# Patient Record
Sex: Male | Born: 1985
Health system: Southern US, Community
[De-identification: ages and names within clinical notes are randomized; demographics above are authoritative.]

## PROBLEM LIST (undated history)

## (undated) DIAGNOSIS — Z800 Family history of malignant neoplasm of digestive organs: Secondary | ICD-10-CM

## (undated) DIAGNOSIS — F329 Major depressive disorder, single episode, unspecified: Secondary | ICD-10-CM

## (undated) DIAGNOSIS — K635 Polyp of colon: Secondary | ICD-10-CM

## (undated) DIAGNOSIS — B20 Human immunodeficiency virus [HIV] disease: Secondary | ICD-10-CM

## (undated) DIAGNOSIS — Z9151 Personal history of suicidal behavior: Secondary | ICD-10-CM

## (undated) DIAGNOSIS — F32A Depression, unspecified: Secondary | ICD-10-CM

## (undated) DIAGNOSIS — Z21 Asymptomatic human immunodeficiency virus [HIV] infection status: Secondary | ICD-10-CM

## (undated) DIAGNOSIS — Z915 Personal history of self-harm: Secondary | ICD-10-CM

## (undated) HISTORY — DX: Personal history of suicidal behavior: Z91.51

## (undated) HISTORY — DX: Major depressive disorder, single episode, unspecified: F32.9

## (undated) HISTORY — DX: Depression, unspecified: F32.A

## (undated) HISTORY — DX: Polyp of colon: K63.5

## (undated) HISTORY — DX: Family history of malignant neoplasm of digestive organs: Z80.0

## (undated) HISTORY — DX: Human immunodeficiency virus (HIV) disease: B20

## (undated) HISTORY — DX: Personal history of self-harm: Z91.5

## (undated) HISTORY — DX: Asymptomatic human immunodeficiency virus (hiv) infection status: Z21

---

## 2010-11-26 ENCOUNTER — Emergency Department (HOSPITAL_COMMUNITY): Payer: Self-pay

## 2010-11-26 ENCOUNTER — Emergency Department (HOSPITAL_COMMUNITY)
Admission: EM | Admit: 2010-11-26 | Discharge: 2010-11-26 | Disposition: A | Discharge status: Home / Self Care | Payer: Self-pay | Attending: Emergency Medicine | Admitting: Emergency Medicine

## 2010-11-26 DIAGNOSIS — IMO0002 Reserved for concepts with insufficient information to code with codable children: Secondary | ICD-10-CM | POA: Insufficient documentation

## 2010-11-26 DIAGNOSIS — L299 Pruritus, unspecified: Secondary | ICD-10-CM | POA: Insufficient documentation

## 2010-11-26 DIAGNOSIS — T368X5A Adverse effect of other systemic antibiotics, initial encounter: Secondary | ICD-10-CM | POA: Insufficient documentation

## 2010-11-26 DIAGNOSIS — M25429 Effusion, unspecified elbow: Secondary | ICD-10-CM | POA: Insufficient documentation

## 2010-11-26 DIAGNOSIS — Y9210 Unspecified residential institution as the place of occurrence of the external cause: Secondary | ICD-10-CM | POA: Insufficient documentation

## 2010-11-26 DIAGNOSIS — M25529 Pain in unspecified elbow: Secondary | ICD-10-CM | POA: Insufficient documentation

## 2010-11-26 LAB — BASIC METABOLIC PANEL
BUN: 8 mg/dL (ref 6–23)
CO2: 23 mEq/L (ref 19–32)
Calcium: 8.8 mg/dL (ref 8.4–10.5)
Chloride: 103 mEq/L (ref 96–112)
Creatinine, Ser: 0.57 mg/dL (ref 0.4–1.5)
GFR calc Af Amer: >60 mL/min (ref >60)
GFR calc non Af Amer: >60 mL/min (ref >60)
Glucose, Bld: 90 mg/dL (ref 70–99)
Potassium: 3.8 mEq/L (ref 3.5–5.1)
Sodium: 136 mEq/L (ref 135–145)

## 2010-11-26 LAB — CBC
HCT: 37.8 % — ABNORMAL LOW (ref 39.0–52.0)
Hemoglobin: 13.5 g/dL (ref 13.0–17.0)
MCH: 30.6 pg (ref 26.0–34.0)
MCHC: 35.7 g/dL (ref 30.0–36.0)
MCV: 85.7 fL (ref 78.0–100.0)
Platelets: 172 10*3/uL (ref 150–400)
RBC: 4.41 MIL/uL (ref 4.22–5.81)
RDW: 12.3 % (ref 11.5–15.5)
WBC: 8.4 10*3/uL (ref 4.0–10.5)

## 2010-11-26 LAB — DIFFERENTIAL
Basophils Absolute: 0 10*3/uL (ref 0.0–0.1)
Basophils Relative: 0 % (ref 0–1)
Eosinophils Absolute: 0 10*3/uL (ref 0.0–0.7)
Eosinophils Relative: 1 % (ref 0–5)
Lymphocytes Relative: 14 % (ref 12–46)
Lymphs Abs: 1.2 10*3/uL (ref 0.7–4.0)
Monocytes Absolute: 0.9 10*3/uL (ref 0.1–1.0)
Monocytes Relative: 11 % (ref 3–12)
Neutro Abs: 6.3 10*3/uL (ref 1.7–7.7)
Neutrophils Relative %: 75 % (ref 43–77)

## 2010-11-28 ENCOUNTER — Emergency Department (HOSPITAL_COMMUNITY)
Admission: EM | Admit: 2010-11-28 | Discharge: 2010-11-28 | Disposition: A | Discharge status: Home / Self Care | Payer: Self-pay | Attending: Emergency Medicine | Admitting: Emergency Medicine

## 2010-11-28 DIAGNOSIS — S6990XA Unspecified injury of unspecified wrist, hand and finger(s), initial encounter: Secondary | ICD-10-CM | POA: Insufficient documentation

## 2010-11-28 DIAGNOSIS — M25429 Effusion, unspecified elbow: Secondary | ICD-10-CM | POA: Insufficient documentation

## 2010-11-28 DIAGNOSIS — Y92009 Unspecified place in unspecified non-institutional (private) residence as the place of occurrence of the external cause: Secondary | ICD-10-CM | POA: Insufficient documentation

## 2010-11-28 DIAGNOSIS — R509 Fever, unspecified: Secondary | ICD-10-CM | POA: Insufficient documentation

## 2010-11-28 DIAGNOSIS — M25529 Pain in unspecified elbow: Secondary | ICD-10-CM | POA: Insufficient documentation

## 2010-11-28 DIAGNOSIS — IMO0002 Reserved for concepts with insufficient information to code with codable children: Secondary | ICD-10-CM | POA: Insufficient documentation

## 2010-11-28 DIAGNOSIS — X58XXXA Exposure to other specified factors, initial encounter: Secondary | ICD-10-CM | POA: Insufficient documentation

## 2010-11-28 DIAGNOSIS — S59909A Unspecified injury of unspecified elbow, initial encounter: Secondary | ICD-10-CM | POA: Insufficient documentation

## 2010-11-28 LAB — DIFFERENTIAL
Basophils Absolute: 0 10*3/uL (ref 0.0–0.1)
Basophils Relative: 0 % (ref 0–1)
Eosinophils Absolute: 0.1 10*3/uL (ref 0.0–0.7)
Eosinophils Relative: 1 % (ref 0–5)
Lymphocytes Relative: 14 % (ref 12–46)
Lymphs Abs: 1 10*3/uL (ref 0.7–4.0)
Monocytes Absolute: 0.8 10*3/uL (ref 0.1–1.0)
Monocytes Relative: 11 % (ref 3–12)
Neutro Abs: 5.2 10*3/uL (ref 1.7–7.7)
Neutrophils Relative %: 74 % (ref 43–77)

## 2010-11-28 LAB — POCT I-STAT, CHEM 8
BUN: 7 mg/dL (ref 6–23)
Calcium, Ion: 1.1 mmol/L — ABNORMAL LOW (ref 1.12–1.32)
Chloride: 103 mEq/L (ref 96–112)
Creatinine, Ser: 1 mg/dL (ref 0.4–1.5)
Glucose, Bld: 104 mg/dL — ABNORMAL HIGH (ref 70–99)
HCT: 38 % — ABNORMAL LOW (ref 39.0–52.0)
Hemoglobin: 12.9 g/dL — ABNORMAL LOW (ref 13.0–17.0)
Potassium: 3.6 mEq/L (ref 3.5–5.1)
Sodium: 135 mEq/L (ref 135–145)
TCO2: 20 mmol/L (ref 0–100)

## 2010-11-28 LAB — CBC
HCT: 35.4 % — ABNORMAL LOW (ref 39.0–52.0)
Hemoglobin: 12.7 g/dL — ABNORMAL LOW (ref 13.0–17.0)
MCH: 30.5 pg (ref 26.0–34.0)
MCHC: 35.9 g/dL (ref 30.0–36.0)
MCV: 85.1 fL (ref 78.0–100.0)
Platelets: 184 10*3/uL (ref 150–400)
RBC: 4.16 MIL/uL — ABNORMAL LOW (ref 4.22–5.81)
RDW: 12.1 % (ref 11.5–15.5)
WBC: 7.1 10*3/uL (ref 4.0–10.5)

## 2010-12-11 NOTE — Op Note (Signed)
  NAMEDEREKE, NEUMANN              ACCOUNT NO.:  [id number]  MEDICAL RECORD NO.:  [id number]  LOCATION:  [facility]                         FACILITY:  [facility]  PHYSICIAN:  Harvie Junior, M.D.   DATE OF BIRTH:  Nov 27, 1985  DATE OF PROCEDURE:  11/26/2010 DATE OF DISCHARGE:  11/28/2010                              OPERATIVE REPORT   PREOPERATIVE DIAGNOSIS:  Infected olecranon bursitis, right.  POSTOPERATIVE DIAGNOSIS:  Infected olecranon bursitis, right.  PROCEDURES:  Incision and drainage of infected right olecranon bursitis.  SURGEON:  Harvie Junior, MD  ASSISTANT:  Marshia Ly, P.A.  ANESTHESIA:  Local.  BRIEF HISTORY:  Mr. Sciarra is a 25 year old male with a history of having had cellulitis and fluctuance of his right elbow.  It began getting worse and worse, thicker and thicker, was having fevers, nausea, vomiting, loss of appetite.  Because of these complaints, the patient presented to the emergency room.  Examination showed that he had a significant cellulitic elbow with some fluctuance, right over the elbow itself.  We felt that I and D is the most appropriate course of action.  PROCEDURE:  The patient in a stretcher because of failure of conservative care and infected olecranon bursitis, had a routine prep and drape of the elbow and then a small incision was made over the elbow and an irrigation and debridement through this wound was covered, and a Q-tip was used to clean this out as best as we could.  At this point, a small wick was put in place.  Sterile compressive dressing was applied. The patient then had a posterior splint applied and he was discharged to follow up in our office.     Harvie Junior, M.D.     Ranae Plumber  D:  11/30/2010  T:  12/01/2010  Job:  [id number]  Electronically Signed by Jodi Geralds M.D. on 12/11/2010 12:20:43 AM

## 2010-12-11 NOTE — Consult Note (Signed)
  NAME:  GURVEER, COLUCCI NO.:  [id number]  MEDICAL RECORD NO.:  [id number]  LOCATION:  [facility]                         FACILITY:  [facility]  PHYSICIAN:  Harvie Junior, M.D.   DATE OF BIRTH:  May 08, 1986  DATE OF CONSULTATION:  11/26/2010 DATE OF DISCHARGE:  11/26/2010                                CONSULTATION   Mr. Beale is a 25 year old male with a several-day history of increasing redness over the right elbow, increasing pain, and because of continued complaints of pain, he presented to the emergency room with redness, pain, and have been running some fevers at home.  He basically says that he had been having intermittent moderate stabbing pain associated with swelling, getting better since started.  It  does not awake him from sleep, that work made worse and rest made better.  He came in for evaluation.  PAST MEDICAL HISTORY:  Remarkable for not being allergic to medications. He denies diabetes, high cholesterol.  REVIEW OF SYSTEMS:  A 14-point systems were reviewed thoroughly, positive only for some loss of appetite, fever, __________, rash, and headaches.  His medical problems were positive for depression.  He has been hospitalized for depression in the past.  FAMILY HISTORY:  Reviewed and noncontributory.  SOCIAL HISTORY:  He is a pack-a-day smoker and drinks couple of times a week.  He works at [employer] as a Production assistant, radio.  PHYSICAL EXAMINATION:  He was a well-developed, well-nourished male in no acute distress.  Eyes are not dilated.  Not using accessory muscle for respiration.  No rash on exposed skin.  Right elbow showed that he had significant erythema over a very large area of the elbow, probably a 15 x 3 cm area of erythema.  There was some local fluctuance right at the elbow itself.  Overall it is our assessment that this potentially could be treated as an outpatient where we are very much concerned about that possibility, so we felt  that most proper course of action for him was going to be to do a bedside I and D and to treat him with IV antibiotics in the emergency room and then to follow up in our office for treatment with IM and p.o. antibiotics and an additional drainage as needed.  The drainage procedure will be dictated under separate operative note.    Harvie Junior, M.D.    Ranae Plumber  D:  11/30/2010  T:  12/01/2010  Job:  [id number]  Electronically Signed by Jodi Geralds M.D. on 12/11/2010 12:20:41 AM

## 2011-07-10 ENCOUNTER — Emergency Department (HOSPITAL_COMMUNITY)
Admission: EM | Admit: 2011-07-10 | Discharge: 2011-07-10 | Disposition: A | Discharge status: Home / Self Care | Payer: Self-pay | Attending: Emergency Medicine | Admitting: Emergency Medicine

## 2011-07-10 ENCOUNTER — Encounter (HOSPITAL_COMMUNITY): Payer: Self-pay

## 2011-07-10 DIAGNOSIS — R51 Headache: Secondary | ICD-10-CM | POA: Insufficient documentation

## 2011-07-10 DIAGNOSIS — IMO0001 Reserved for inherently not codable concepts without codable children: Secondary | ICD-10-CM | POA: Insufficient documentation

## 2011-07-10 DIAGNOSIS — R630 Anorexia: Secondary | ICD-10-CM | POA: Insufficient documentation

## 2011-07-10 DIAGNOSIS — J3489 Other specified disorders of nose and nasal sinuses: Secondary | ICD-10-CM | POA: Insufficient documentation

## 2011-07-10 DIAGNOSIS — R05 Cough: Secondary | ICD-10-CM | POA: Insufficient documentation

## 2011-07-10 DIAGNOSIS — B349 Viral infection, unspecified: Secondary | ICD-10-CM

## 2011-07-10 DIAGNOSIS — R42 Dizziness and giddiness: Secondary | ICD-10-CM | POA: Insufficient documentation

## 2011-07-10 DIAGNOSIS — B9789 Other viral agents as the cause of diseases classified elsewhere: Secondary | ICD-10-CM | POA: Insufficient documentation

## 2011-07-10 DIAGNOSIS — R5383 Other fatigue: Secondary | ICD-10-CM | POA: Insufficient documentation

## 2011-07-10 DIAGNOSIS — M2550 Pain in unspecified joint: Secondary | ICD-10-CM | POA: Insufficient documentation

## 2011-07-10 DIAGNOSIS — R112 Nausea with vomiting, unspecified: Secondary | ICD-10-CM | POA: Insufficient documentation

## 2011-07-10 DIAGNOSIS — R059 Cough, unspecified: Secondary | ICD-10-CM | POA: Insufficient documentation

## 2011-07-10 DIAGNOSIS — R5381 Other malaise: Secondary | ICD-10-CM | POA: Insufficient documentation

## 2011-07-10 DIAGNOSIS — R070 Pain in throat: Secondary | ICD-10-CM | POA: Insufficient documentation

## 2011-07-10 DIAGNOSIS — R509 Fever, unspecified: Secondary | ICD-10-CM | POA: Insufficient documentation

## 2011-07-10 LAB — RAPID STREP SCREEN (MED CTR MEBANE ONLY): Streptococcus, Group A Screen (Direct): NEGATIVE

## 2011-07-10 MED ORDER — ONDANSETRON HCL 4 MG PO TABS: 4.0000 mg | ORAL_TABLET | Freq: Four times a day (QID) | ORAL | Status: AC

## 2011-07-10 MED ORDER — ONDANSETRON 4 MG PO TBDP
4.0000 mg | ORAL_TABLET | Freq: Once | ORAL | Status: AC
  Administered 2011-07-10: 4 mg via ORAL
  Filled 2011-07-10: qty 1

## 2011-07-10 MED ORDER — IBUPROFEN 800 MG PO TABS
800.0000 mg | ORAL_TABLET | Freq: Once | ORAL | Status: AC
  Administered 2011-07-10: 800 mg via ORAL
  Filled 2011-07-10: qty 1

## 2011-07-10 NOTE — ED Notes (Signed)
MD at bedside. 

## 2011-07-10 NOTE — ED Notes (Signed)
Pt c/o general flu symptoms x 4 days.  Has been taking motrin at home for fever

## 2011-07-10 NOTE — ED Notes (Signed)
Pt drank 3 apple juices without nausea.  States he feels some better

## 2011-07-10 NOTE — ED Provider Notes (Signed)
History     CSN: [id number]  Arrival date & time 07/10/11  0803   First MD Initiated Contact with Patient 07/10/11 [phone]      Chief Complaint  Patient presents with  . Influenza    (Consider location/radiation/quality/duration/timing/severity/associated sxs/prior treatment) HPI Comments: 4 days of body aches, nausea, vomiting, fever, congestion, cough.  Poor PO intake.  Last vomiting last night.  No chest pain, SOB.  Mild sore throat.  Cough is nonproductive.  No rash.  NSAIDs at home for fever relief.    Patient is a 26 y.o. male presenting with flu symptoms. The history is provided by the patient.  Influenza This is a new problem. The current episode started more than 2 days ago. The problem occurs constantly. The problem has not changed since onset.Associated symptoms include headaches. Pertinent negatives include no chest pain, no abdominal pain and no shortness of breath. The symptoms are aggravated by nothing. The symptoms are relieved by nothing. He has tried acetaminophen for the symptoms.    History reviewed. No pertinent past medical history.  History reviewed. No pertinent past surgical history.  No family history on file.  History  Substance Use Topics  . Smoking status: Not on file  . Smokeless tobacco: Not on file  . Alcohol Use: Yes     occasional      Review of Systems  Constitutional: Positive for fever, chills, activity change, appetite change and fatigue.  HENT: Positive for congestion, sore throat and rhinorrhea. Negative for trouble swallowing.   Eyes: Negative for visual disturbance.  Respiratory: Positive for cough. Negative for shortness of breath.   Cardiovascular: Negative for chest pain.  Gastrointestinal: Positive for nausea and vomiting. Negative for abdominal pain and diarrhea.  Genitourinary: Negative for dysuria and hematuria.  Musculoskeletal: Positive for myalgias and arthralgias. Negative for back pain.  Neurological: Positive for  weakness, light-headedness and headaches. Negative for dizziness.    Allergies  Review of patient's allergies indicates no known allergies.  Home Medications   Current Outpatient Rx  Name Route Sig Dispense Refill  . IBUPROFEN 200 MG PO TABS Oral Take 200-400 mg by mouth every 6 (six) hours as needed.    Marland Kitchen ONDANSETRON HCL 4 MG PO TABS Oral Take 1 tablet (4 mg total) by mouth every 6 (six) hours. 12 tablet 0    BP 110/70  Pulse 92  Temp(Src) 100.2 F (37.9 C) (Oral)  SpO2 98%  Physical Exam  Constitutional: He is oriented to person, place, and time. He appears well-developed and well-nourished. No distress.  HENT:  Head: Normocephalic and atraumatic.  Mouth/Throat: Oropharynx is clear and moist. No oropharyngeal exudate.       Mild erythema to oropharynx. No asymmetry  Eyes: Conjunctivae and EOM are normal.  Neck: Normal range of motion. Neck supple.       No meningismus  Cardiovascular: Normal rate, regular rhythm and normal heart sounds.   No murmur heard. Pulmonary/Chest: Effort normal and breath sounds normal. No respiratory distress. He has no wheezes.  Abdominal: Soft. There is no tenderness. There is no rebound and no guarding.  Musculoskeletal: Normal range of motion. He exhibits no edema and no tenderness.  Neurological: He is alert and oriented to person, place, and time. No cranial nerve deficit.  Skin: Skin is warm.    ED Course  Procedures (including critical care time)   Labs Reviewed  RAPID STREP SCREEN   No results found.   1. Viral syndrome  MDM  Viral syndrome with body aches, fever, nausea, vomiting.  No meningismus, normal neuro exam. Abdomen soft.  Antipyretics, fluids, zofran Patient tolerating by mouth in the ED, abdomen soft.       Glynn Octave, MD 07/10/11 [phone]

## 2011-07-10 NOTE — ED Notes (Signed)
Pt drinking po apple juice without problem

## 2011-11-04 LAB — TB SKIN TEST
Induration: 0 mm
TB Skin Test: NEGATIVE

## 2011-11-15 ENCOUNTER — Ambulatory Visit (INDEPENDENT_AMBULATORY_CARE_PROVIDER_SITE_OTHER): Payer: Self-pay

## 2011-11-15 ENCOUNTER — Other Ambulatory Visit: Payer: Self-pay | Admitting: Internal Medicine

## 2011-11-15 DIAGNOSIS — Z79899 Other long term (current) drug therapy: Secondary | ICD-10-CM

## 2011-11-15 DIAGNOSIS — B20 Human immunodeficiency virus [HIV] disease: Secondary | ICD-10-CM

## 2011-11-15 DIAGNOSIS — Z113 Encounter for screening for infections with a predominantly sexual mode of transmission: Secondary | ICD-10-CM

## 2011-11-15 LAB — CBC WITH DIFFERENTIAL/PLATELET
Basophils Absolute: 0 10*3/uL (ref 0.0–0.1)
Basophils Relative: 1 % (ref 0–1)
Eosinophils Absolute: 0.1 10*3/uL (ref 0.0–0.7)
Eosinophils Relative: 2 % (ref 0–5)
HCT: 42.3 % (ref 39.0–52.0)
Hemoglobin: 15 g/dL (ref 13.0–17.0)
Lymphocytes Relative: 30 % (ref 12–46)
Lymphs Abs: 1.2 10*3/uL (ref 0.7–4.0)
MCH: 30.1 pg (ref 26.0–34.0)
MCHC: 35.5 g/dL (ref 30.0–36.0)
MCV: 84.9 fL (ref 78.0–100.0)
Monocytes Absolute: 0.4 10*3/uL (ref 0.1–1.0)
Monocytes Relative: 10 % (ref 3–12)
Neutro Abs: 2.2 10*3/uL (ref 1.7–7.7)
Neutrophils Relative %: 57 % (ref 43–77)
Platelets: 190 10*3/uL (ref 150–400)
RBC: 4.98 MIL/uL (ref 4.22–5.81)
RDW: 12.9 % (ref 11.5–15.5)
WBC: 3.9 10*3/uL — ABNORMAL LOW (ref 4.0–10.5)

## 2011-11-15 LAB — LIPID PANEL
Cholesterol: 155 mg/dL (ref 0–200)
HDL: 65 mg/dL (ref >39)
LDL Cholesterol: 82 mg/dL (ref 0–99)
Total CHOL/HDL Ratio: 2.4 Ratio
Triglycerides: 39 mg/dL (ref <150)
VLDL: 8 mg/dL (ref 0–40)

## 2011-11-15 LAB — COMPLETE METABOLIC PANEL WITH GFR
ALT: 12 U/L (ref 0–53)
AST: 17 U/L (ref 0–37)
Albumin: 4.4 g/dL (ref 3.5–5.2)
Alkaline Phosphatase: 60 U/L (ref 39–117)
BUN: 11 mg/dL (ref 6–23)
CO2: 31 mEq/L (ref 19–32)
Calcium: 9.1 mg/dL (ref 8.4–10.5)
Chloride: 102 mEq/L (ref 96–112)
Creat: 0.86 mg/dL (ref 0.50–1.35)
GFR, Est African American: >89 mL/min
GFR, Est Non African American: >89 mL/min
Glucose, Bld: 104 mg/dL — ABNORMAL HIGH (ref 70–99)
Potassium: 4.1 mEq/L (ref 3.5–5.3)
Sodium: 138 mEq/L (ref 135–145)
Total Bilirubin: 0.5 mg/dL (ref 0.3–1.2)
Total Protein: 7 g/dL (ref 6.0–8.3)

## 2011-11-15 LAB — HEPATITIS B SURFACE ANTIBODY,QUALITATIVE: Hep B S Ab: NEGATIVE

## 2011-11-15 LAB — RPR

## 2011-11-15 LAB — HEPATITIS C ANTIBODY: HCV Ab: NEGATIVE

## 2011-11-15 LAB — HEPATITIS B SURFACE ANTIGEN: Hepatitis B Surface Ag: NEGATIVE

## 2011-11-16 DIAGNOSIS — F329 Major depressive disorder, single episode, unspecified: Secondary | ICD-10-CM | POA: Insufficient documentation

## 2011-11-16 LAB — URINALYSIS
Bilirubin Urine: NEGATIVE
Glucose, UA: NEGATIVE mg/dL
Hgb urine dipstick: NEGATIVE
Ketones, ur: NEGATIVE mg/dL
Leukocytes, UA: NEGATIVE
Nitrite: NEGATIVE
Protein, ur: NEGATIVE mg/dL
Specific Gravity, Urine: 1.02 (ref 1.005–1.030)
Urobilinogen, UA: 0.2 mg/dL (ref 0.0–1.0)
pH: 6 (ref 5.0–8.0)

## 2011-11-16 LAB — HEPATITIS B CORE ANTIBODY, TOTAL: Hep B Core Total Ab: NEGATIVE

## 2011-11-16 LAB — T-HELPER CELL (CD4) - (RCID CLINIC ONLY)
CD4 % Helper T Cell: 37 % (ref 33–55)
CD4 T Cell Abs: 450 uL (ref 400–2700)

## 2011-11-16 LAB — HIV-1 RNA ULTRAQUANT REFLEX TO GENTYP+
HIV 1 RNA Quant: 26059 copies/mL — ABNORMAL HIGH (ref <20)
HIV-1 RNA Quant, Log: 4.42 {Log} — ABNORMAL HIGH (ref <1.30)

## 2011-11-16 LAB — HEPATITIS A ANTIBODY, TOTAL: Hep A Total Ab: NEGATIVE

## 2011-11-16 NOTE — Progress Notes (Signed)
Pt states increased depression with recent diagnosis of HIV.  He would like to seek mental health counseling.  Referral given for [facility] [place].  Pt advised to call ASAP due to previous history of mental health diagnosis.  He attends school part- time .

## 2011-11-18 ENCOUNTER — Ambulatory Visit: Payer: Self-pay

## 2011-11-22 LAB — HIV-1 GENOTYPR PLUS

## 2011-12-01 ENCOUNTER — Encounter: Payer: Self-pay | Admitting: Internal Medicine

## 2011-12-01 ENCOUNTER — Other Ambulatory Visit: Payer: Self-pay | Admitting: Internal Medicine

## 2011-12-01 ENCOUNTER — Ambulatory Visit (INDEPENDENT_AMBULATORY_CARE_PROVIDER_SITE_OTHER): Payer: Self-pay | Admitting: Internal Medicine

## 2011-12-01 VITALS — BP 134/78 | HR 86 | Temp 98.0°F | Ht 75.0 in | Wt 154.0 lb

## 2011-12-01 DIAGNOSIS — F32A Depression, unspecified: Secondary | ICD-10-CM

## 2011-12-01 DIAGNOSIS — Z23 Encounter for immunization: Secondary | ICD-10-CM

## 2011-12-01 DIAGNOSIS — F329 Major depressive disorder, single episode, unspecified: Secondary | ICD-10-CM

## 2011-12-01 DIAGNOSIS — F3289 Other specified depressive episodes: Secondary | ICD-10-CM

## 2011-12-01 DIAGNOSIS — Z21 Asymptomatic human immunodeficiency virus [HIV] infection status: Secondary | ICD-10-CM

## 2011-12-01 DIAGNOSIS — B20 Human immunodeficiency virus [HIV] disease: Secondary | ICD-10-CM

## 2011-12-01 MED ORDER — ELVITEG-COBIC-EMTRICIT-TENOFDF 150-150-200-300 MG PO TABS: 1.0000 | ORAL_TABLET | Freq: Every day | ORAL | Status: DC

## 2011-12-01 NOTE — Patient Instructions (Signed)
Have your lab tests done 3-4 weeks after starting your medicine.

## 2011-12-01 NOTE — Assessment & Plan Note (Signed)
I discussed treatment options and he is eager to start.  He does feel he will be compliant.  I discussed side effects of the different options and he has decided to start Stribild.  He does have a history of depression and behavioral health hospital admission so will avoid Atripla.  He will start as soon as he qualifies for ADAP.  He will return 3-4 weeks for labs after starting the meds and with me 1-2 weeks later.  He was told to call if he is having any difficulty with the meds and not to stop without discussing with me or other provider first.  I spent > 45 minutes with more than half with face to face counselling.  He was reminded to use condoms with all sexual activity.  He also will be referred to a primary doctor with his concerns of colon cancer and other, non-ID related issues.

## 2011-12-01 NOTE — Assessment & Plan Note (Addendum)
I did discuss with him depression and need for mental health counselling.  He was seen by our provider today and will continue to followup.  No SI.

## 2011-12-01 NOTE — Progress Notes (Signed)
  Subjective:    Patient ID: James Jensen, male    DOB: 06/30/1985, 26 y.o.   MRN: [id number]  HPI Here to as a new patient with 042.  This is a new diagnosis for him and he is adjusting to the news.  He has a history of GC and/or chlamydia infection (he is not sure) years ago and no known herpes or orther STIs.  Is interested in treatment.  Also has many other concerns related to mother's colon cancer and his risks, rash on back and concern for cancer.  Does smoke daily.  Complains of depression, particularly associated with the diagnosis.  He does have questions regarding medication side effects and different options.  He does feel he is ready to take medicine every day.     Review of Systems  Constitutional: Negative for fever, chills, fatigue and unexpected weight change.  HENT: Negative for sore throat and trouble swallowing.   Respiratory: Negative for cough, chest tightness and shortness of breath.   Cardiovascular: Negative for chest pain, palpitations and leg swelling.  Gastrointestinal: Negative for nausea, abdominal pain and diarrhea.  Genitourinary: Negative for discharge and genital sores.  Musculoskeletal: Negative for myalgias, joint swelling and arthralgias.  Neurological: Negative for dizziness, weakness and headaches.  Hematological: Negative for adenopathy.  Psychiatric/Behavioral: Positive for dysphoric mood. The patient is not nervous/anxious.        Objective:   Physical Exam  Constitutional: He is oriented to person, place, and time. He appears well-developed and well-nourished. No distress.  HENT:  Mouth/Throat: Oropharynx is clear and moist. No oropharyngeal exudate.       Small (less than 1/4 cm) right ant cervical lad  Cardiovascular: Normal rate, regular rhythm and normal heart sounds.  Exam reveals no gallop and no friction rub.   No murmur heard. Pulmonary/Chest: Effort normal and breath sounds normal. No respiratory distress. He has no wheezes. He has no  rales.  Abdominal: Soft. Bowel sounds are normal. He exhibits no distension. There is no tenderness.  Genitourinary: Penis normal. No penile tenderness.  Lymphadenopathy:    He has cervical adenopathy.  Neurological: He is alert and oriented to person, place, and time.  Skin: Skin is warm and dry.  Psychiatric: He has a normal mood and affect. His behavior is normal.          Assessment & Plan:

## 2011-12-06 ENCOUNTER — Ambulatory Visit: Payer: Self-pay

## 2011-12-06 DIAGNOSIS — F331 Major depressive disorder, recurrent, moderate: Secondary | ICD-10-CM

## 2011-12-06 NOTE — Progress Notes (Unsigned)
Subjective:    Patient ID: James Jensen is a 26 y.o. male.  Chief Complaint: {ZOX:09604} {Additional complaints:19341::" "} Data Review: {Data Review:18591} {Common ambulatory SmartLinks:19316::" "} ROS  Objective:  Physical Exam  Laboratory:  {Lab Links:19343::" "}  Assessment:   {Assessment:19344}  Plan:   {VWUJ:81191}

## 2011-12-06 NOTE — Patient Instructions (Addendum)
Plan to meet in one week.
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
## 2011-12-13 ENCOUNTER — Ambulatory Visit: Payer: Self-pay

## 2011-12-13 IMAGING — CR DG ELBOW COMPLETE 3+V*R*
4 series · 4 of 4 positions shown · non-contrast
Comparison: None.

CLINICAL DATA: Elbow swelling and limited range of motion.

RIGHT ELBOW - COMPLETE 3+ VIEW

[x elbow joint ap right]
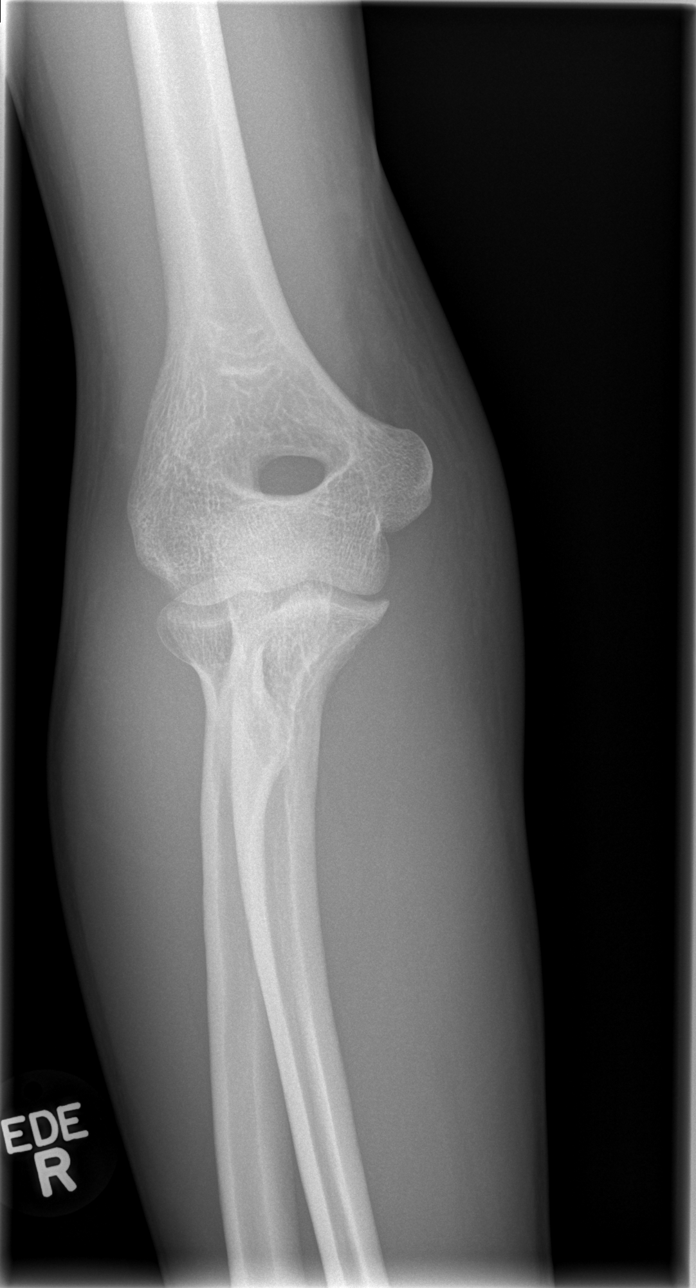

[x elbow joint obl. right (1 of 2)]
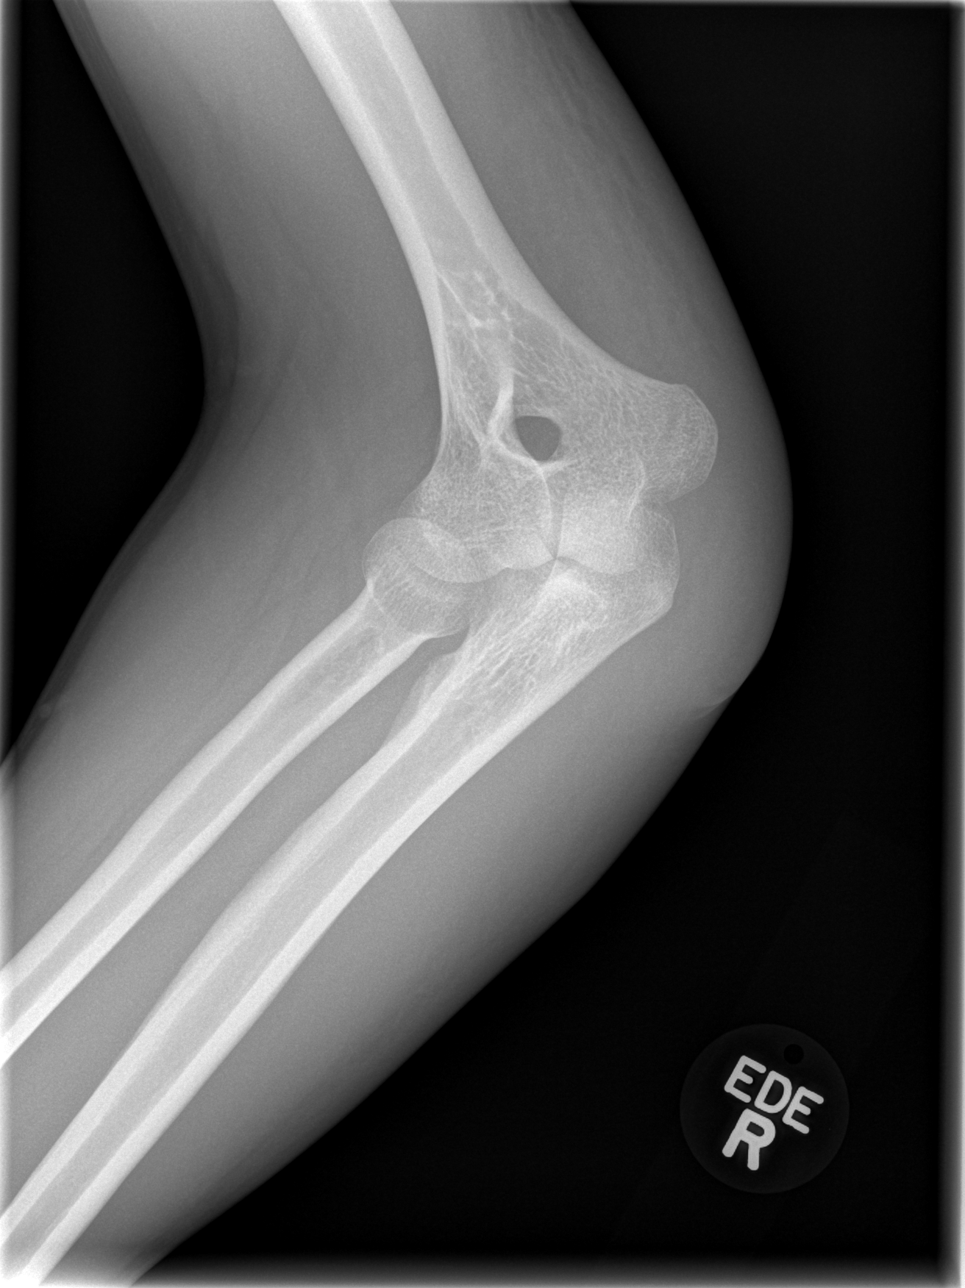

[x elbow joint obl. right (2 of 2)]
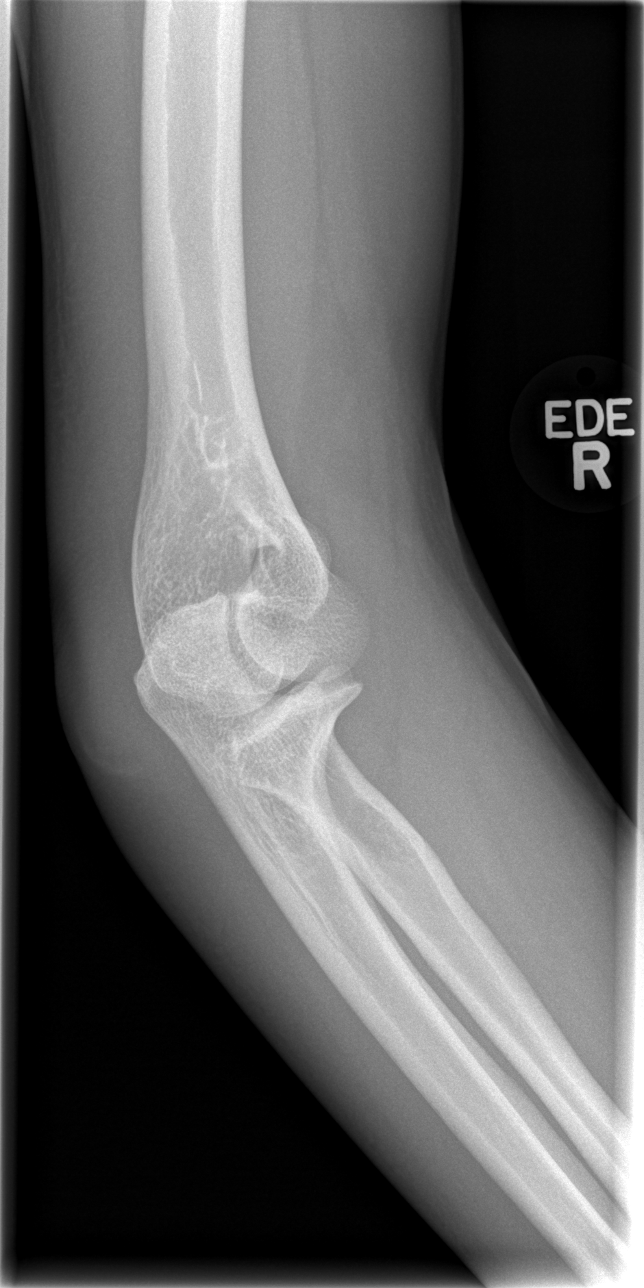

[x elbow joint lat right]
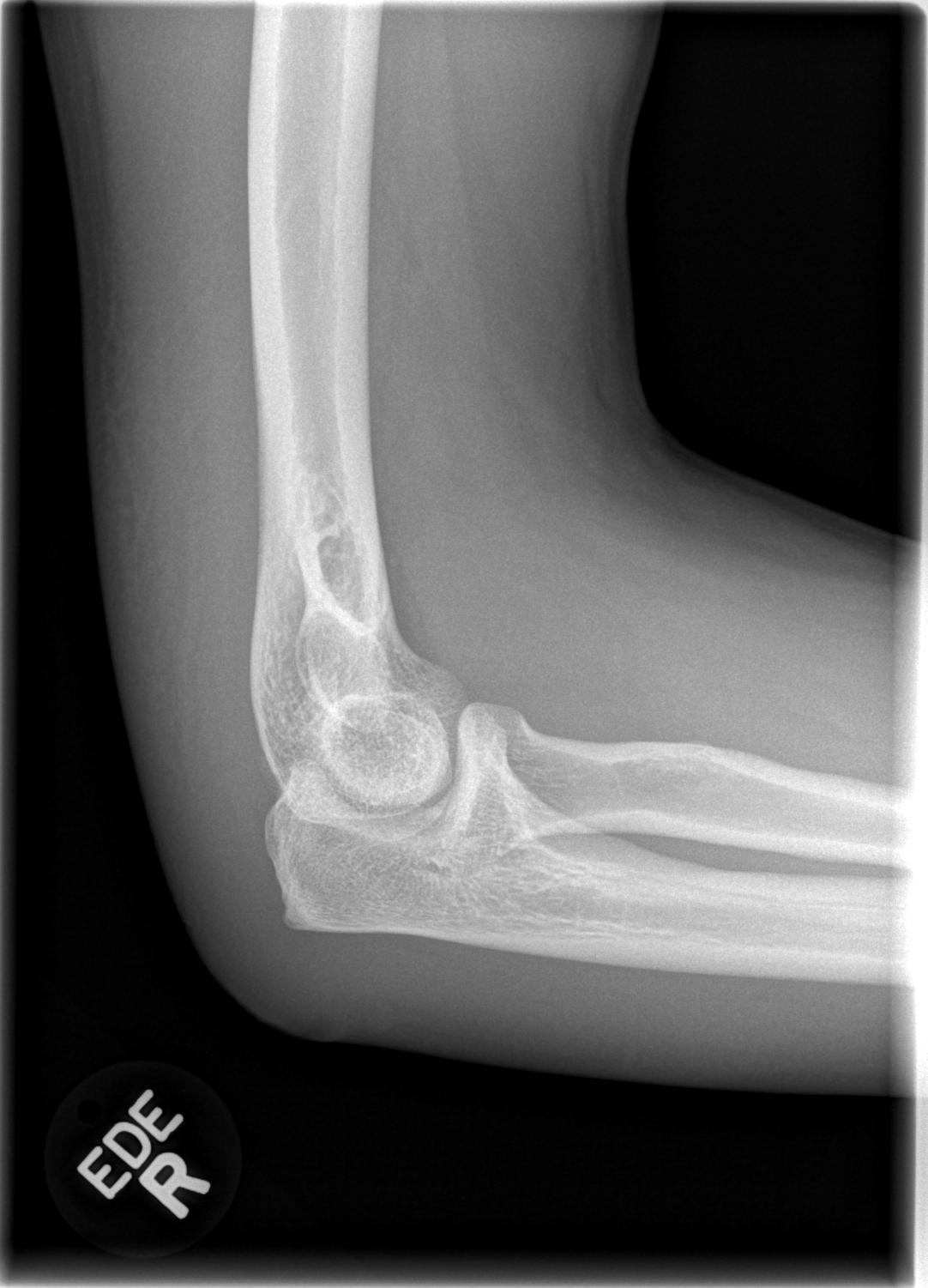

[4 of 4 positions shown; findings below may reference images not displayed]

FINDINGS: There is soft tissue swelling about the olecranon
compatible with olecranon bursitis.  No fracture, dislocation,
radiopaque foreign body, degenerative change or elbow joint
effusion is identified.
IMPRESSION: Findings compatible with olecranon bursitis.  No acute bony
abnormality.

## 2011-12-14 ENCOUNTER — Other Ambulatory Visit: Payer: Self-pay | Admitting: *Deleted

## 2011-12-14 DIAGNOSIS — B20 Human immunodeficiency virus [HIV] disease: Secondary | ICD-10-CM

## 2011-12-14 MED ORDER — ELVITEG-COBIC-EMTRICIT-TENOFDF 150-150-200-300 MG PO TABS: 1.0000 | ORAL_TABLET | Freq: Every day | ORAL | Status: DC

## 2011-12-14 NOTE — Telephone Encounter (Signed)
Patient ADAP was approved and Rx needed to go to the pharmacy.

## 2011-12-15 ENCOUNTER — Other Ambulatory Visit: Payer: Self-pay | Admitting: *Deleted

## 2011-12-15 DIAGNOSIS — B20 Human immunodeficiency virus [HIV] disease: Secondary | ICD-10-CM

## 2011-12-15 MED ORDER — ELVITEG-COBIC-EMTRICIT-TENOFDF 150-150-200-300 MG PO TABS: 1.0000 | ORAL_TABLET | Freq: Every day | ORAL | Status: DC

## 2012-01-24 ENCOUNTER — Other Ambulatory Visit (INDEPENDENT_AMBULATORY_CARE_PROVIDER_SITE_OTHER): Payer: Self-pay

## 2012-01-24 DIAGNOSIS — B20 Human immunodeficiency virus [HIV] disease: Secondary | ICD-10-CM

## 2012-01-24 LAB — CBC WITH DIFFERENTIAL/PLATELET
Basophils Absolute: 0 10*3/uL (ref 0.0–0.1)
Basophils Relative: 1 % (ref 0–1)
Eosinophils Absolute: 0.1 10*3/uL (ref 0.0–0.7)
Eosinophils Relative: 2 % (ref 0–5)
HCT: 39.5 % (ref 39.0–52.0)
Hemoglobin: 14 g/dL (ref 13.0–17.0)
Lymphocytes Relative: 40 % (ref 12–46)
Lymphs Abs: 2 10*3/uL (ref 0.7–4.0)
MCH: 29.7 pg (ref 26.0–34.0)
MCHC: 35.4 g/dL (ref 30.0–36.0)
MCV: 83.7 fL (ref 78.0–100.0)
Monocytes Absolute: 0.4 10*3/uL (ref 0.1–1.0)
Monocytes Relative: 8 % (ref 3–12)
Neutro Abs: 2.5 10*3/uL (ref 1.7–7.7)
Neutrophils Relative %: 49 % (ref 43–77)
Platelets: 188 10*3/uL (ref 150–400)
RBC: 4.72 MIL/uL (ref 4.22–5.81)
RDW: 13.9 % (ref 11.5–15.5)
WBC: 5 10*3/uL (ref 4.0–10.5)

## 2012-01-25 LAB — COMPLETE METABOLIC PANEL WITH GFR
ALT: 18 U/L (ref 0–53)
AST: 18 U/L (ref 0–37)
Albumin: 4.1 g/dL (ref 3.5–5.2)
Alkaline Phosphatase: 64 U/L (ref 39–117)
BUN: 12 mg/dL (ref 6–23)
CO2: 27 mEq/L (ref 19–32)
Calcium: 9.1 mg/dL (ref 8.4–10.5)
Chloride: 103 mEq/L (ref 96–112)
Creat: 0.93 mg/dL (ref 0.50–1.35)
GFR, Est African American: >89 mL/min
GFR, Est Non African American: >89 mL/min
Glucose, Bld: 91 mg/dL (ref 70–99)
Potassium: 4.3 mEq/L (ref 3.5–5.3)
Sodium: 138 mEq/L (ref 135–145)
Total Bilirubin: 0.4 mg/dL (ref 0.3–1.2)
Total Protein: 7.2 g/dL (ref 6.0–8.3)

## 2012-01-26 LAB — HIV-1 RNA QUANT-NO REFLEX-BLD
HIV 1 RNA Quant: <20 copies/mL (ref <20)
HIV-1 RNA Quant, Log: <1.3 {Log} (ref <1.30)

## 2012-01-26 LAB — T-HELPER CELL (CD4) - (RCID CLINIC ONLY)
CD4 % Helper T Cell: 30 % — ABNORMAL LOW (ref 33–55)
CD4 T Cell Abs: 630 uL (ref 400–2700)

## 2012-02-03 ENCOUNTER — Ambulatory Visit: Payer: Self-pay

## 2012-02-06 ENCOUNTER — Encounter: Payer: Self-pay | Admitting: Internal Medicine

## 2012-02-06 ENCOUNTER — Ambulatory Visit (INDEPENDENT_AMBULATORY_CARE_PROVIDER_SITE_OTHER): Payer: Self-pay | Admitting: Internal Medicine

## 2012-02-06 VITALS — BP 130/76 | HR 65 | Temp 97.7°F | Ht 75.0 in | Wt 151.1 lb

## 2012-02-06 DIAGNOSIS — F341 Dysthymic disorder: Secondary | ICD-10-CM

## 2012-02-06 DIAGNOSIS — F329 Major depressive disorder, single episode, unspecified: Secondary | ICD-10-CM

## 2012-02-06 DIAGNOSIS — Z1211 Encounter for screening for malignant neoplasm of colon: Secondary | ICD-10-CM

## 2012-02-06 NOTE — Progress Notes (Signed)
Subjective:   Patient ID: James Jensen male   DOB: June 17, 1986 26 y.o.   MRN: [id number]  HPI: James Jensen is a 26 y.o. man with recently diagnosed HIV 2 months prior. He was referred to the internal medicine clinic from ID clinic for evaluation of depression.  He reports that his depression was in reaction to his new diagnosis of HIV. He notes improved mood, decent appetite, decent sleep. He does have energy to go to work and to do school. 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 No increased agitation. No recent suicidal ideation. No homicidal ideation. He has seen the therapist at the ID clinic once, but is not interested in continuing to see a counselor.  He is concerned about his risk for cancer. He notes that there've been several cancers that run in his family including lung cancer, colon cancer, and breast cancer. He reports knots in his back intermittently for the last year and would like to be sure that this is not related to cancer. He does report that knots are massaged out but then always recur. Today he notes a knot just below his left shoulder blade.  Past Medical History  Diagnosis Date  . Depression     Admission to Mental Hospital   . H/O suicide attempt     as teenager.  "Cutting" Depression r/t being a" closet" gay teen   . HIV infection    Current Outpatient Prescriptions  Medication Sig Dispense Refill  . elvitegravir-cobicistat-emtricitabine-tenofovir (STRIBILD) 150-150-200-300 MG TABS Take 1 tablet by mouth daily with breakfast.  30 tablet  5  . ibuprofen (ADVIL,MOTRIN) 200 MG tablet Take 200-400 mg by mouth every 6 (six) hours as needed.       Family History  Problem Relation Age of Onset  . Cancer Mother     Cancer gene    Colon  . Brain cancer Brother   . Breast cancer Paternal Grandmother   . Lung cancer Maternal Grandfather   . Colon polyps Sister    History   Social History  . Marital Status: Single    Spouse Name: N/A    Number of Children: N/A    . Years of Education: N/A   Social History Main Topics  . Smoking status: Former Smoker -- 0.5 packs/day for 14 years    Types: Cigarettes  . Smokeless tobacco: Never Used   Comment: quit x 1 week  . Alcohol Use: 1.0 oz/week    2 drink(s) per week     Drink3-4 times/week  . Drug Use: 3 per week    Special: Marijuana     sometimes  . Sexually Active: Yes -- Male partner(s)     pt. declined condoms   Other Topics Concern  . None   Social History Narrative   Patient is currently in school for cosmetology license   Review of Systems: Constitutional: Denies fever, chills, diaphoresis, appetite change and fatigue.  HEENT: Denies photophobia, eye pain, redness, hearing loss, ear pain, congestion, sore throat, rhinorrhea, sneezing, mouth sores, trouble swallowing, neck pain, neck stiffness and tinnitus.   Respiratory: Denies SOB, DOE, cough, chest tightness,  and wheezing.   Cardiovascular: Denies chest pain, palpitations and leg swelling.  Gastrointestinal: Denies vomiting, abdominal pain, constipation, blood in stool and abdominal distention.  some diarrhea and nausea this morning- he took he took medication without eating Genitourinary: Denies dysuria, urgency, frequency, hematuria, flank pain and difficulty urinating.  Musculoskeletal: Denies  joint swelling, arthralgias and gait problem.  Skin: Denies  pallor, rash and wound.  Neurological: Denies dizziness, seizures, syncope, weakness, light-headedness, numbness and headaches.  Psychiatric/Behavioral: Denies suicidal ideation, mood changes, confusion, nervousness, sleep disturbance and agitation  Objective:  Physical Exam: Filed Vitals:   02/06/12 1547  BP: 130/76  Pulse: 65  Temp: 97.7 F (36.5 C)  TempSrc: Oral  Height: 6\' 3"  (1.905 m)  Weight: 151 lb 1.6 oz (68.539 kg)   Constitutional: Vital signs reviewed.  Patient is a well-developed and well-nourished man in no acute distress and cooperative with exam. Mouth: no  erythema or exudates, MMM Eyes: PERRL, EOMI, conjunctivae normal, No scleral icterus.  Cardiovascular: RRR, S1 normal, S2 normal, no MRG, pulses symmetric and intact bilaterally Pulmonary/Chest: CTAB, no wheezes, rales, or rhonchi Abdominal: Soft. Non-tender, non-distended, bowel sounds are normal, no masses, organomegaly, or guarding present.  Musculoskeletal: No joint deformities, erythema, or stiffness, ROM full and no nontender; palpable muscular contraction on inferior border of the medial left shoulder blade that feels better with deep palpation  Hematology: no cervical  or supraclavicular adenopathy.  Neurological: A&O x3, Strength is normal and symmetric bilaterally, cranial nerve II-XII are grossly intact, no focal motor deficit, sensory intact to light touch bilaterally.  Skin: Warm, dry and intact. No rash, cyanosis, or clubbing.  Psychiatric: Normal mood and affect. speech and behavior is normal. Judgment and thought content normal. Cognition and memory are normal.   Assessment & Plan:   Case and care discussed with Dr. Aundria Rud. Please see problem oriented charting for further details.  Patient to return in 1 year for routine follow up, or sooner if needed.

## 2012-02-06 NOTE — Assessment & Plan Note (Signed)
In the past, it was related to him being "closet homosexual."  Recently, he was depressed with his diagnosis of HIV, but his mood is significantly more improved as he has dealt with his knee diagnosis.  -He understands that he should call us if ever he feels that his depressed mood is interfering with his life. He understands to go to the emergency room if he has suicidal or homicidal ideation. He is not interested in therapy or pharmacological intervention at this time.

## 2012-02-06 NOTE — Patient Instructions (Addendum)
-  If you can, please collect the records of the genetic testing that was done on your mother and sister (including the physician who ordered the tests/did the colonoscopy), and have records sent to [place].    -Regarding you muscular pain, I would continue to massage them out, or you may use a tennis ball as we discussed.  -Continue taking your medications as scheduled, you're doing great!  Please be sure to bring all of your medications with you to every visit.  Should you have any new or worsening symptoms, please be sure to call the clinic at [phone].

## 2012-02-06 NOTE — Assessment & Plan Note (Signed)
He reports that his mother and sister have been tested for colon cancer gene, but he is not certain what that James Jensen is. He notes that his mother was positive for the gene, and was diagnosed with colon cancer at the age of 64. His sister was negative for the gene, but underwent colonoscopy and premalignant polyps were discovered.  -I asked that he acquire records regarding which gene his mother and sister were tested for and which physician ordered these tests and have them faxed to our clinic; at that time we will decide if he needs to be tested for the gene, if he needs urgent colonoscopy, or if he needs to be screened earlier at the age of 17

## 2012-02-07 ENCOUNTER — Ambulatory Visit: Payer: Self-pay | Admitting: Internal Medicine

## 2012-02-16 ENCOUNTER — Ambulatory Visit: Payer: Self-pay | Admitting: Internal Medicine

## 2012-02-23 ENCOUNTER — Encounter: Payer: Self-pay | Admitting: Internal Medicine

## 2012-02-23 ENCOUNTER — Ambulatory Visit (INDEPENDENT_AMBULATORY_CARE_PROVIDER_SITE_OTHER): Payer: Self-pay | Admitting: Internal Medicine

## 2012-02-23 VITALS — BP 137/72 | HR 61 | Temp 97.7°F | Ht 75.0 in | Wt 157.0 lb

## 2012-02-23 DIAGNOSIS — B20 Human immunodeficiency virus [HIV] disease: Secondary | ICD-10-CM

## 2012-02-23 NOTE — Assessment & Plan Note (Signed)
He is doing excellent with his Stribild. He is now undetectable with a CD4 count in the normal range. Encouraged continued and compliance. I suspect his nausea and diarrhea is more related to immune reconstitution and will continue to take Stribild. If he continues to have problems with nausea and diarrhea, will consider referral to gastroenterology or medication change but encouraged him to continue as is now as I suspect is related to improving his HIV. He knows to call if he has worsening symptoms or weight loss which she has not had to date, and otherwise he will return in 3 months.

## 2012-02-23 NOTE — Progress Notes (Signed)
  Subjective:    Patient ID: James Jensen, male    DOB: 1985-08-08, 26 y.o.   MRN: [id number]  HPI He comes in here for routine follow up of his HIV. He was started on Stribild last visit which was with his first visit and is taking it well. He has no issues with the medication and does take it daily and has not missed any doses. He does have some diarrhea and nausea that occurs occasionally. No rashes or other concerns. He has seen a counselor for depression and all questions have been answered.   Review of Systems  Constitutional: Negative for fever, chills and unexpected weight change.  HENT: Negative for sore throat and trouble swallowing.   Respiratory: Negative for cough and shortness of breath.   Cardiovascular: Negative for chest pain, palpitations and leg swelling.  Gastrointestinal: Positive for nausea and diarrhea. Negative for vomiting and abdominal pain.  Musculoskeletal: Negative for myalgias and arthralgias.  Skin: Negative for rash.  Neurological: Negative for dizziness and headaches.       Objective:   Physical Exam  Constitutional: He appears well-developed and well-nourished. No distress.  HENT:  Mouth/Throat: Oropharynx is clear and moist. No oropharyngeal exudate.  Cardiovascular: Normal rate, regular rhythm and normal heart sounds.  Exam reveals no gallop and no friction rub.   No murmur heard. Pulmonary/Chest: Effort normal and breath sounds normal. No respiratory distress. He has no wheezes. He has no rales.  Lymphadenopathy:    He has no cervical adenopathy.          Assessment & Plan:

## 2012-02-27 NOTE — Progress Notes (Unsigned)
James Jensen is a 26 year old single Caucasian male with a prior psychiatric history. increased irritability lately, coming out as anger usually.  He reports crying spells, increased sleeping, decreased appetite, suicidal ideation with no plan or intent, confusion, some paranoia, hot flashes and some anxiety.  He admits to drinking "more than usual" lately.  He says his concentration is okay as well as his interest in things.  He said he used to have panic attacks, but has not had one lately.  He stated that he had 2 psychiatric hospitalizations in his mid teens for suicidal thoughts related to coming out being gay.  He said he broke bottles and would cut his legs, which led to the first hospitalization around age 65 or 5.  The second one was about one year later.  He grew up in [place], [place], where he said he no longer feels safe because of being gay.  He said it is much easier to live in [place], [place] as a gay male.  I provided some basic psycho-education on cognitive behavioral therapy (CBT) - how thoughts affect feelings - and he responded positively to this.  He does have some support from friends and said he regularly gets a professional massage to help feel better physically.  Plan to expand on the CBT in future sessions and also address his use of alcohol.   Franne Forts, LCSW

## 2012-03-05 ENCOUNTER — Ambulatory Visit (INDEPENDENT_AMBULATORY_CARE_PROVIDER_SITE_OTHER): Payer: Self-pay

## 2012-03-05 DIAGNOSIS — Z23 Encounter for immunization: Secondary | ICD-10-CM

## 2012-05-10 ENCOUNTER — Other Ambulatory Visit (INDEPENDENT_AMBULATORY_CARE_PROVIDER_SITE_OTHER): Payer: Self-pay

## 2012-05-10 DIAGNOSIS — B20 Human immunodeficiency virus [HIV] disease: Secondary | ICD-10-CM

## 2012-05-10 LAB — COMPLETE METABOLIC PANEL WITH GFR
ALT: 15 U/L (ref 0–53)
AST: 18 U/L (ref 0–37)
Albumin: 4.5 g/dL (ref 3.5–5.2)
Alkaline Phosphatase: 70 U/L (ref 39–117)
BUN: 10 mg/dL (ref 6–23)
CO2: 28 mEq/L (ref 19–32)
Calcium: 9.3 mg/dL (ref 8.4–10.5)
Chloride: 101 mEq/L (ref 96–112)
Creat: 1.01 mg/dL (ref 0.50–1.35)
GFR, Est African American: >89 mL/min
GFR, Est Non African American: >89 mL/min
Glucose, Bld: 99 mg/dL (ref 70–99)
Potassium: 4.4 mEq/L (ref 3.5–5.3)
Sodium: 138 mEq/L (ref 135–145)
Total Bilirubin: 0.6 mg/dL (ref 0.3–1.2)
Total Protein: 7.2 g/dL (ref 6.0–8.3)

## 2012-05-10 LAB — CBC WITH DIFFERENTIAL/PLATELET
Basophils Absolute: 0 10*3/uL (ref 0.0–0.1)
Basophils Relative: 0 % (ref 0–1)
Eosinophils Absolute: 0.1 10*3/uL (ref 0.0–0.7)
Eosinophils Relative: 2 % (ref 0–5)
HCT: 39.8 % (ref 39.0–52.0)
Hemoglobin: 14.2 g/dL (ref 13.0–17.0)
Lymphocytes Relative: 46 % (ref 12–46)
Lymphs Abs: 2.2 10*3/uL (ref 0.7–4.0)
MCH: 31.2 pg (ref 26.0–34.0)
MCHC: 35.7 g/dL (ref 30.0–36.0)
MCV: 87.5 fL (ref 78.0–100.0)
Monocytes Absolute: 0.5 10*3/uL (ref 0.1–1.0)
Monocytes Relative: 11 % (ref 3–12)
Neutro Abs: 2 10*3/uL (ref 1.7–7.7)
Neutrophils Relative %: 41 % — ABNORMAL LOW (ref 43–77)
Platelets: 204 10*3/uL (ref 150–400)
RBC: 4.55 MIL/uL (ref 4.22–5.81)
RDW: 13 % (ref 11.5–15.5)
WBC: 4.8 10*3/uL (ref 4.0–10.5)

## 2012-05-11 LAB — T-HELPER CELL (CD4) - (RCID CLINIC ONLY)
CD4 % Helper T Cell: 29 % — ABNORMAL LOW (ref 33–55)
CD4 T Cell Abs: 580 uL (ref 400–2700)

## 2012-05-12 LAB — HIV-1 RNA QUANT-NO REFLEX-BLD
HIV 1 RNA Quant: <20 copies/mL (ref <20)
HIV-1 RNA Quant, Log: <1.3 {Log} (ref <1.30)

## 2012-05-24 ENCOUNTER — Ambulatory Visit (INDEPENDENT_AMBULATORY_CARE_PROVIDER_SITE_OTHER): Payer: Self-pay | Admitting: Internal Medicine

## 2012-05-24 ENCOUNTER — Encounter: Payer: Self-pay | Admitting: Internal Medicine

## 2012-05-24 VITALS — BP 136/76 | HR 73 | Temp 97.7°F | Ht 75.0 in | Wt 155.0 lb

## 2012-05-24 DIAGNOSIS — B20 Human immunodeficiency virus [HIV] disease: Secondary | ICD-10-CM

## 2012-05-24 DIAGNOSIS — Z113 Encounter for screening for infections with a predominantly sexual mode of transmission: Secondary | ICD-10-CM

## 2012-05-24 DIAGNOSIS — Z23 Encounter for immunization: Secondary | ICD-10-CM

## 2012-05-24 DIAGNOSIS — R21 Rash and other nonspecific skin eruption: Secondary | ICD-10-CM

## 2012-05-24 NOTE — Assessment & Plan Note (Signed)
He is doing very well and will continue with Stribild. He knows to call if he has any significant problems with this medication but otherwise well return in 6 months. He knows to use condoms with sexual activity.

## 2012-05-24 NOTE — Addendum Note (Signed)
Addended by: Wendall Mola A on: 05/24/2012 11:28 AM   Modules accepted: Orders

## 2012-05-24 NOTE — Progress Notes (Signed)
  Subjective:    Patient ID: James Jensen, male    DOB: [birth year], 26 y.o.   MRN: [id number]  HPI He comes in for routine follow up of HIV. He had been started on Stribild and reports excellent compliance. No missed doses though he did take one dose late but only once. He has no further problems with nausea since he started taking the medication with food. He does have a red spot on his foot that is been there for some months and a skin colored lesion on his penis.   Review of Systems  Constitutional: Negative for appetite change and fatigue.  HENT: Negative for sore throat and trouble swallowing.   Respiratory: Negative for cough and shortness of breath.   Cardiovascular: Negative for chest pain, palpitations and leg swelling.  Gastrointestinal: Negative for nausea, abdominal pain and diarrhea.  Musculoskeletal: Negative for myalgias, joint swelling and arthralgias.  Skin: Positive for rash.  Neurological: Negative for dizziness.  Hematological: Negative for adenopathy.  Psychiatric/Behavioral: The patient is not nervous/anxious.        Objective:   Physical Exam  Constitutional: He appears well-developed and well-nourished. No distress.  HENT:  Mouth/Throat: No oropharyngeal exudate.  Cardiovascular: Normal rate, regular rhythm and normal heart sounds.  Exam reveals no gallop and no friction rub.   Pulmonary/Chest: Effort normal and breath sounds normal. No respiratory distress. He has no rales.  Abdominal: Soft. Bowel sounds are normal. There is no tenderness.          Assessment & Plan:

## 2012-05-24 NOTE — Assessment & Plan Note (Signed)
I'm unsure what the red spot is on his foot. I am going to test him for syphilis just in case though this is not a typical area. It also has been there for months. If not, it likely is nothing significant. He also does have a lesion on his penis that he is unwilling to show me today. It does sound like a wart and I did discuss the palpitations and ability spread warts and need for condoms.

## 2012-05-25 LAB — RPR

## 2012-06-21 ENCOUNTER — Other Ambulatory Visit: Payer: Self-pay | Admitting: Licensed Clinical Social Worker

## 2012-06-21 DIAGNOSIS — B20 Human immunodeficiency virus [HIV] disease: Secondary | ICD-10-CM

## 2012-06-21 MED ORDER — ELVITEG-COBIC-EMTRICIT-TENOFDF 150-150-200-300 MG PO TABS: 1.0000 | ORAL_TABLET | Freq: Every day | ORAL | Status: DC

## 2012-07-23 ENCOUNTER — Ambulatory Visit (INDEPENDENT_AMBULATORY_CARE_PROVIDER_SITE_OTHER): Payer: Self-pay | Admitting: Internal Medicine

## 2012-07-23 ENCOUNTER — Encounter: Payer: Self-pay | Admitting: Internal Medicine

## 2012-07-23 VITALS — BP 127/71 | HR 61 | Temp 97.6°F | Wt 151.4 lb

## 2012-07-23 DIAGNOSIS — H109 Unspecified conjunctivitis: Secondary | ICD-10-CM | POA: Insufficient documentation

## 2012-07-23 MED ORDER — EYE LUBRICANT OP OINT: TOPICAL_OINTMENT | OPHTHALMIC | Status: DC

## 2012-07-23 MED ORDER — NAPHAZOLINE HCL 0.1 % OP SOLN: 1.0000 [drp] | Freq: Four times a day (QID) | OPHTHALMIC | Status: DC | PRN

## 2012-07-23 NOTE — Progress Notes (Signed)
Patient ID: James Jensen, male   DOB: 1986/03/17, 27 y.o.   MRN: [id number] HPI: Mr. James Jensen is a 27 yo M with well controlled HIV presents for 1 day history of left eye redness/swollen and itchiness but no tenderness. +sticky discharge but not sure of color.  He feels Dry/gritty in left eye. He reports that the light does give him burning sensation but no visual changes or difficulty seeing.  NO contact lenses. No sick contact or cold symptoms.  Denies Severe headaches with nausea, or Recent trauma to the eye.  He states that he did go out late last night until 2-3am.   He does have a cat at home. ROS: as per HPI  PE: General: alert, well-developed, and cooperative to examination. Eyes: right eye unremarkable.  Left eye: erythematous conjunctiva with dilated blood vessel. No drainage noted.  Has ecchymotic 2cm x0.5cm on medial aspect of upper eyelid but no tenderness to palpation or edema noted.    Lungs: normal respiratory effort, no accessory muscle use, normal breath sounds, no crackles, and no wheezes. Heart: normal rate, regular rhythm, no murmur, no gallop, and no rub.  Abdomen: soft, non-tender, normal bowel sounds, no distention, no guarding, no rebound tenderness Neurologic: nonfocal Skin: turgor normal and no rashes.  Psych: appropriate

## 2012-07-23 NOTE — Patient Instructions (Addendum)
Please use Refresh Optive eye Lubricant  Use Naphcon 4 times daily on left eyes Wash your hands Warm compress 15 mins 3 times per day Do not go to school in the next 1 week or until your eye improve Follow up as needed- in 1 week if no improvement

## 2012-07-23 NOTE — Assessment & Plan Note (Addendum)
Likely viral in etiology.  Case discussed with DR. Criselda Peaches and we do not think that this is a bacterial conjunctivitis as he does not have the purulent discharge noted on exam.  He reports no history of trauma therefore, foreign body is unlikely.  He did tell us that he was out with his friend at a bar until 2-3AM the day prior and might not remember what happened. He does have a cat which makes bartonella possible but his CD4 is >500's and the ecchymosis is not consistent with bartonella lesion.  This will take about 1-2 weeks to resolve.   -Will treat with Eye lubricant for his gritty sensation -Will treat with antihistamine-Naphcon eye drop on left eye QID.   -Advised him to wash his hands frequently and not to rub in his eyes -Can also do some warm compress on left eyes as well -Return to clinic in 1 week if no improvement or if he develops symptoms such as pain, headache, n/v, difficulty seeing, or worsen photophobia  Addendum: patient called on 2/5 stating that his eye is not getting better and the bruise on his upper eyelid is getting worse and would like to be seen by opthalmology.  Will send referral to opthal--he has [facility] card so I don't know when he will be able to get an appointment with opthal.  If continue to worsen, need to go to ED.

## 2012-07-25 ENCOUNTER — Telehealth: Payer: Self-pay | Admitting: *Deleted

## 2012-07-25 NOTE — Telephone Encounter (Signed)
Patient called re: recent viral pink eye diagnosis by PCP, now with bruising around eye.  Has called his PCP, they have referred him to an opthamologist today.  He called here, asking if this is anything he should be worried about.  RN advised him that this sounded like an appropriate referral and if he doesn't get an appointment we would make him an appointment here. Patient in agreement with this plan, will call tomorrow if he needs an appointment. Andree Coss, RN

## 2012-07-25 NOTE — Addendum Note (Signed)
Addended by: Carrolyn Meiers T on: 07/25/2012 01:08 PM   Modules accepted: Orders

## 2012-08-14 NOTE — Addendum Note (Signed)
Addended by: Neomia Dear on: 08/14/2012 02:46 PM   Modules accepted: Orders

## 2012-08-15 ENCOUNTER — Encounter: Payer: Self-pay | Admitting: Internal Medicine

## 2012-08-22 ENCOUNTER — Ambulatory Visit: Payer: Self-pay

## 2012-08-22 ENCOUNTER — Other Ambulatory Visit: Payer: Self-pay

## 2012-08-27 ENCOUNTER — Ambulatory Visit: Payer: Self-pay

## 2012-11-22 ENCOUNTER — Other Ambulatory Visit (INDEPENDENT_AMBULATORY_CARE_PROVIDER_SITE_OTHER): Payer: Self-pay

## 2012-11-22 DIAGNOSIS — B20 Human immunodeficiency virus [HIV] disease: Secondary | ICD-10-CM

## 2012-11-23 LAB — T-HELPER CELL (CD4) - (RCID CLINIC ONLY)
CD4 % Helper T Cell: 37 % (ref 33–55)
CD4 T Cell Abs: 690 uL (ref 400–2700)

## 2012-11-23 LAB — HIV-1 RNA QUANT-NO REFLEX-BLD
HIV 1 RNA Quant: <20 copies/mL (ref <20)
HIV-1 RNA Quant, Log: <1.3 {Log} (ref <1.30)

## 2012-12-06 ENCOUNTER — Ambulatory Visit (INDEPENDENT_AMBULATORY_CARE_PROVIDER_SITE_OTHER): Payer: No Typology Code available for payment source | Admitting: Internal Medicine

## 2012-12-06 ENCOUNTER — Encounter: Payer: Self-pay | Admitting: Internal Medicine

## 2012-12-06 VITALS — BP 130/76 | HR 62 | Temp 97.9°F | Ht 75.0 in | Wt 156.0 lb

## 2012-12-06 DIAGNOSIS — Z800 Family history of malignant neoplasm of digestive organs: Secondary | ICD-10-CM

## 2012-12-06 DIAGNOSIS — H109 Unspecified conjunctivitis: Secondary | ICD-10-CM

## 2012-12-06 DIAGNOSIS — Z1211 Encounter for screening for malignant neoplasm of colon: Secondary | ICD-10-CM

## 2012-12-06 DIAGNOSIS — B20 Human immunodeficiency virus [HIV] disease: Secondary | ICD-10-CM

## 2012-12-06 DIAGNOSIS — Z113 Encounter for screening for infections with a predominantly sexual mode of transmission: Secondary | ICD-10-CM

## 2012-12-06 DIAGNOSIS — Z79899 Other long term (current) drug therapy: Secondary | ICD-10-CM

## 2012-12-06 DIAGNOSIS — A630 Anogenital (venereal) warts: Secondary | ICD-10-CM

## 2012-12-06 MED ORDER — ELVITEG-COBIC-EMTRICIT-TENOFDF 150-150-200-300 MG PO TABS: 1.0000 | ORAL_TABLET | Freq: Every day | ORAL | Status: DC

## 2012-12-06 NOTE — Progress Notes (Signed)
  Subjective:    Patient ID: James Jensen, male    DOB: 07-31-1985, 27 y.o.   MRN: [id number]  HPI He comes in for followup of his HIV. He was started on Stribild last year and developed an undetectable viral load and good CD4 count. His most recent CD4 count 690. He has had no problems with the medications and feels well. No nausea, no weight loss, no lymphadenopathy or rashes.  Does have a general lesion and is interested in a GI referral for significant family history of polyps that sound like are consistent with juvenile polyps.   Review of Systems  Constitutional: Negative for unexpected weight change.  HENT: Negative for sore throat and trouble swallowing.   Gastrointestinal: Negative for nausea and diarrhea.  Skin: Negative for rash.  Neurological: Negative for dizziness, light-headedness and headaches.  Hematological: Negative for adenopathy.  Psychiatric/Behavioral: Negative for dysphoric mood.       Objective:   Physical Exam        Assessment & Plan:

## 2012-12-06 NOTE — Assessment & Plan Note (Signed)
He is doing excellent with his HIV and will return in 6 months for routine followup including fasting labs.

## 2012-12-06 NOTE — Assessment & Plan Note (Signed)
He will be referred to dermatology.

## 2012-12-06 NOTE — Assessment & Plan Note (Signed)
This has resolved.

## 2012-12-06 NOTE — Assessment & Plan Note (Signed)
He reports a significant family history and will be referred to GI for evaluation for colonoscopy.

## 2012-12-19 ENCOUNTER — Telehealth: Payer: Self-pay | Admitting: *Deleted

## 2012-12-19 ENCOUNTER — Other Ambulatory Visit: Payer: Self-pay | Admitting: Licensed Clinical Social Worker

## 2012-12-19 DIAGNOSIS — B20 Human immunodeficiency virus [HIV] disease: Secondary | ICD-10-CM

## 2012-12-19 MED ORDER — ELVITEG-COBIC-EMTRICIT-TENOFDF 150-150-200-300 MG PO TABS: 1.0000 | ORAL_TABLET | Freq: Every day | ORAL | Status: DC

## 2012-12-19 NOTE — Telephone Encounter (Signed)
Called Patient James Jensen and James Jensen was approved for [employer] for his co-pays and deductible.  He is approved until 12-18-13.

## 2012-12-24 ENCOUNTER — Telehealth: Payer: Self-pay | Admitting: *Deleted

## 2012-12-24 NOTE — Telephone Encounter (Signed)
Received call from [place] wanting the information from [facility] in order to get his medication.  I gave him  ID [id number], Bin [id number] and group [id number].

## 2012-12-24 NOTE — Telephone Encounter (Signed)
RN called [employer].  Obtained Prior Authorization for 3 years for Stribild.  Will fax copy of prior authorization to [facility] to process prescription.

## 2013-02-05 LAB — HM COLONOSCOPY

## 2013-02-19 ENCOUNTER — Encounter (HOSPITAL_COMMUNITY): Payer: Self-pay | Admitting: Emergency Medicine

## 2013-02-19 ENCOUNTER — Emergency Department (HOSPITAL_COMMUNITY)
Admission: EM | Admit: 2013-02-19 | Discharge: 2013-02-19 | Disposition: A | Discharge status: Home / Self Care | Payer: No Typology Code available for payment source | Source: Home / Self Care | Attending: Family Medicine | Admitting: Family Medicine

## 2013-02-19 DIAGNOSIS — J069 Acute upper respiratory infection, unspecified: Secondary | ICD-10-CM

## 2013-02-19 DIAGNOSIS — J029 Acute pharyngitis, unspecified: Secondary | ICD-10-CM

## 2013-02-19 LAB — POCT RAPID STREP A: Streptococcus, Group A Screen (Direct): NEGATIVE

## 2013-02-19 NOTE — ED Provider Notes (Signed)
CSN: [id number]     Arrival date & time 02/19/13  1331 History   First MD Initiated Contact with Patient 02/19/13 1448     Chief Complaint  Patient presents with  . Fever    since sunday  . Sore Throat   (Consider location/radiation/quality/duration/timing/severity/associated sxs/prior Treatment) HPI Comments: 27 year old male presents complaining of sore throat, fever. He has a Radio broadcast assistant with strep so he just wanted to get checked out. He has the sore throat that is a sharp pain, worse with swallowing. He had a fever of 102 the first 2 days but this has since resolved. The sore throat is getting better as well. He is still having some soreness in the lymph nodes of his neck. There is no rash, chest pain, NVD.   Past Medical History  Diagnosis Date  . Depression     [facility]   . H/O suicide attempt     as teenager.  "Cutting" Depression r/t being a" closet" gay teen   . HIV infection    History reviewed. No pertinent past surgical history. Family History  Problem Relation Age of Onset  . Colon cancer Mother     Cancer gene; onset around age 26  . Brain cancer Brother   . Breast cancer Paternal Grandmother   . Lung cancer Maternal Grandfather   . Colon polyps Sister     Genetic testing: PMS2 sequencing revealed deleterious mutation, but EPCAM, MLH1, MSH2 and MSH6 sequencing all normal   History  Substance Use Topics  . Smoking status: Current Some Day Smoker -- 0.10 packs/day for 14 years    Types: Cigarettes  . Smokeless tobacco: Never Used     Comment: quit x 1 week  . Alcohol Use: 1.0 oz/week    2 drink(s) per week     Comment: Drink3-4 times/week    Review of Systems  Constitutional: Positive for fever and chills. Negative for fatigue.  HENT: Positive for sore throat. Negative for neck pain and neck stiffness.   Eyes: Negative for visual disturbance.  Respiratory: Positive for cough. Negative for shortness of breath.   Cardiovascular: Negative for  chest pain, palpitations and leg swelling.  Gastrointestinal: Negative for nausea, vomiting, abdominal pain, diarrhea and constipation.  Genitourinary: Negative for dysuria, urgency, frequency and hematuria.  Musculoskeletal: Negative for myalgias and arthralgias.  Skin: Negative for rash.  Neurological: Negative for dizziness, weakness and light-headedness.    Allergies  Other  Home Medications   Current Outpatient Rx  Name  Route  Sig  Dispense  Refill  . elvitegravir-cobicistat-emtricitabine-tenofovir (STRIBILD) 150-150-200-300 MG TABS   Oral   Take 1 tablet by mouth daily with breakfast.   30 tablet   6   . ibuprofen (ADVIL,MOTRIN) 200 MG tablet   Oral   Take 200-400 mg by mouth every 6 (six) hours as needed.          BP 133/71  Pulse 88  Temp(Src) 98.2 F (36.8 C) (Oral)  Resp 12  SpO2 97% Physical Exam  Nursing note and vitals reviewed. Constitutional: He is oriented to person, place, and time. He appears well-developed and well-nourished. No distress.  HENT:  Head: Normocephalic and atraumatic.  Mouth/Throat: Oropharynx is clear and moist. No oropharyngeal exudate.  Eyes: Conjunctivae are normal. Pupils are equal, round, and reactive to light.  Cardiovascular: Normal rate and regular rhythm.   Pulmonary/Chest: Effort normal. No respiratory distress.  Lymphadenopathy:       Head (right side): Tonsillar adenopathy present.  Head (left side): Tonsillar adenopathy present.  Neurological: He is oriented to person, place, and time. Coordination normal.  Skin: Skin is warm and dry. No rash noted. He is not diaphoretic.  Psychiatric: He has a normal mood and affect. Judgment normal.    ED Course  Procedures (including critical care time) Labs Review Labs Reviewed  CULTURE, GROUP A STREP  POCT RAPID STREP A ([facility] CARE ONLY)   Imaging Review No results found.  MDM   1. Pharyngitis   2. Viral URI    Rapid strep is negative. The throat appears to be  fine. Continue to treat symptomatically, followup if worsening but otherwise nothing to do    Graylon Good, PA-C 02/19/13 2046

## 2013-02-19 NOTE — ED Notes (Signed)
Reports low grade temp and sore throat x 1wk. Pt states around co worker with strep.  Denies any other symptoms

## 2013-02-20 NOTE — ED Provider Notes (Signed)
Medical screening examination/treatment/procedure(s) were performed by resident physician or non-physician practitioner and as supervising physician I was immediately available for consultation/collaboration.   Barkley Bruns MD.   Linna Hoff, MD 02/20/13 [phone]

## 2013-02-21 ENCOUNTER — Telehealth (HOSPITAL_COMMUNITY): Payer: Self-pay | Admitting: *Deleted

## 2013-02-21 LAB — CULTURE, GROUP A STREP

## 2013-02-21 MED ORDER — AMOXICILLIN 500 MG PO CAPS: 500.0000 mg | ORAL_CAPSULE | Freq: Three times a day (TID) | ORAL | Status: DC

## 2013-02-21 NOTE — ED Notes (Signed)
Throat culture: Group A strep (S. Pyogenes).  Lab shown to Dr. Denyse Amass and he e-prescribed Amoxicillin to pt.'s pharmacy the [facility] in [place].  I called pt. and left a message to call. James Jensen 02/21/2013

## 2013-02-22 ENCOUNTER — Telehealth (HOSPITAL_COMMUNITY): Payer: Self-pay | Admitting: *Deleted

## 2013-02-22 NOTE — ED Notes (Addendum)
Pt. called back on my VM @ 1048.  I called and left another message to call between 2-4:30 or call [phone] and ask for me. Vassie Moselle 02/22/2013 Pt. called back.  Pt. verified x 2 and given results.  Pt. told he needs Amoxicillin for Strep.  Pt. said the pharmacy is incorrect and does not even know where [facility] located. He wants Rx. called to [facility] on [address]. Pt. instructed to notify anyone he was in contact with, if they get the same symptoms, to get checked and to take all of the antibiotics.  Pt. voiced understanding.  Rx. called to the pharmacist @ [phone]. Pt.'s preferred pharmacy corrected on his chart. Vassie Moselle 02/22/2013

## 2013-03-14 ENCOUNTER — Telehealth: Payer: Self-pay | Admitting: Genetic Counselor

## 2013-03-14 NOTE — Telephone Encounter (Signed)
LVOM FOR PT TO RETURN CALL IN RE TO REFERRAL.  °

## 2013-03-15 NOTE — Telephone Encounter (Signed)
lvom for pt to return call in re to referral.  °

## 2013-04-15 ENCOUNTER — Other Ambulatory Visit: Payer: Self-pay | Admitting: Internal Medicine

## 2013-04-25 ENCOUNTER — Other Ambulatory Visit: Payer: Self-pay

## 2013-04-30 ENCOUNTER — Telehealth: Payer: Self-pay | Admitting: Genetic Counselor

## 2013-04-30 NOTE — Telephone Encounter (Signed)
CALLED [facility] 02/12 @ 10 W/KAREN POWELL WELCOME PACKET MAILED.

## 2013-05-28 ENCOUNTER — Other Ambulatory Visit: Payer: Self-pay

## 2013-06-11 ENCOUNTER — Ambulatory Visit (INDEPENDENT_AMBULATORY_CARE_PROVIDER_SITE_OTHER): Payer: No Typology Code available for payment source | Admitting: Internal Medicine

## 2013-06-11 ENCOUNTER — Encounter: Payer: Self-pay | Admitting: Internal Medicine

## 2013-06-11 VITALS — BP 130/77 | HR 67 | Temp 98.0°F | Ht 75.0 in | Wt 154.0 lb

## 2013-06-11 DIAGNOSIS — B20 Human immunodeficiency virus [HIV] disease: Secondary | ICD-10-CM

## 2013-06-11 NOTE — Progress Notes (Signed)
  Subjective:    Patient ID: James Jensen, male    DOB: [birth year], 27 y.o.   MRN: [id number]  HPI  He comes in for followup of his HIV. He was started on Stribild in 2013 and developed an undetectable viral load and good CD4 count. His most recent CD4 count 690. He has had no problems with the medications and feels well. No nausea, no weight loss, no lymphadenopathy or rashes.  He is seeing IM in [place] for other issues and had his cholesterol monitored.      Review of Systems  Constitutional: Negative for unexpected weight change.  HENT: Negative for sore throat and trouble swallowing.   Gastrointestinal: Negative for nausea and diarrhea.  Skin: Negative for rash.  Neurological: Negative for dizziness, light-headedness and headaches.  Hematological: Negative for adenopathy.  Psychiatric/Behavioral: Negative for dysphoric mood.       Objective:   Physical Exam  Constitutional: He appears well-developed and well-nourished. No distress.  HENT:  Mouth/Throat: No oropharyngeal exudate.  Eyes: No scleral icterus.  Cardiovascular: Normal rate, regular rhythm and normal heart sounds.   No murmur heard. Pulmonary/Chest: Effort normal and breath sounds normal. No respiratory distress. He has no wheezes.  Lymphadenopathy:    He has no cervical adenopathy.  Skin: No rash noted.  Psychiatric: He has a normal mood and affect.          Assessment & Plan:

## 2013-06-11 NOTE — Assessment & Plan Note (Signed)
Will check CD4 and viral load today.  Follow up in 4 months, sooner if indicated

## 2013-06-12 LAB — T-HELPER CELL (CD4) - (RCID CLINIC ONLY)
CD4 % Helper T Cell: 41 % (ref 33–55)
CD4 T Cell Abs: 680 /uL (ref 400–2700)

## 2013-06-14 LAB — HIV-1 RNA QUANT-NO REFLEX-BLD
HIV 1 RNA Quant: <20 copies/mL (ref <20)
HIV-1 RNA Quant, Log: <1.3 {Log} (ref <1.30)

## 2013-07-02 ENCOUNTER — Other Ambulatory Visit: Payer: Self-pay | Admitting: Internal Medicine

## 2013-07-02 ENCOUNTER — Telehealth: Payer: Self-pay | Admitting: *Deleted

## 2013-07-02 DIAGNOSIS — F329 Major depressive disorder, single episode, unspecified: Secondary | ICD-10-CM

## 2013-07-02 DIAGNOSIS — F32A Depression, unspecified: Secondary | ICD-10-CM

## 2013-07-02 NOTE — Telephone Encounter (Signed)
Patient called and advised that at his last visit he spoke with Marcelino DusterMichelle and Dr Luciana Axeomer about a referral to Mental Health. He advised he is ready for it now and wants to be referred out. Advised him will have Dr Luciana Axeomer put in a referral and have Marcelino DusterMichelle give him a call once the appt is made or if he has to make it for himself.

## 2013-07-05 ENCOUNTER — Other Ambulatory Visit: Payer: Self-pay | Admitting: *Deleted

## 2013-07-05 NOTE — Telephone Encounter (Signed)
Spoke with [facility].  They do not need a referral, the patient can call and schedule himself.  The phone number is [phone].  They provide counselors and psychologists.  Left this information on patient's voicemail, asked him to call and confirm with me that he got it and if this would work for him. Andree CossHowell, Michelle M, RN

## 2013-07-10 ENCOUNTER — Encounter: Payer: Self-pay | Admitting: Internal Medicine

## 2013-08-01 ENCOUNTER — Ambulatory Visit (HOSPITAL_BASED_OUTPATIENT_CLINIC_OR_DEPARTMENT_OTHER): Payer: No Typology Code available for payment source | Admitting: Genetic Counselor

## 2013-08-01 ENCOUNTER — Other Ambulatory Visit: Payer: No Typology Code available for payment source

## 2013-08-01 ENCOUNTER — Encounter: Payer: Self-pay | Admitting: Genetic Counselor

## 2013-08-01 DIAGNOSIS — Z86010 Personal history of colonic polyps: Secondary | ICD-10-CM

## 2013-08-01 DIAGNOSIS — K635 Polyp of colon: Secondary | ICD-10-CM | POA: Insufficient documentation

## 2013-08-01 DIAGNOSIS — Z8371 Family history of colonic polyps: Secondary | ICD-10-CM

## 2013-08-01 DIAGNOSIS — Z800 Family history of malignant neoplasm of digestive organs: Secondary | ICD-10-CM

## 2013-08-01 DIAGNOSIS — IMO0002 Reserved for concepts with insufficient information to code with codable children: Secondary | ICD-10-CM

## 2013-08-01 NOTE — Progress Notes (Signed)
Dr.  Scharlene Gloss requested a consultation for genetic counseling and risk assessment for James Jensen, a 28 y.o. male, for discussion of his personal history of colon polyps and family history of Lynch syndrome.  He presents to clinic today to discuss the possibility of a genetic predisposition to cancer, and to further clarify his risks, as well as his family members' risks for cancer.   HISTORY OF PRESENT ILLNESS: James Jensen is a 28 y.o. male with no personal history of cancer.  Talbert's sister had genetic testing based on her mother's diagnosis of colon cancer and her personal history of colon polyps and was found to have a PMS2 mutation.  James Jensen's mother reports that she is negative for this mutation.  James Jensen had a colonoscopy and was found to have 5 colon polyps.  He is a smoker.  Past Medical History  Diagnosis Date  . Depression     [facility] to [facility]   . H/O suicide attempt     as teenager.  "Cutting" Depression r/t being a" closet" gay teen   . HIV infection   . Colon polyps   . Family history of Lynch syndrome     sister has PMS2 mutation    History reviewed. No pertinent past surgical history.  History   Social History  . Marital Status: Single    Spouse Name: N/A    Number of Children: 0  . Years of Education: N/A   Occupational History  . SERVER/BARTENDER    Social History Main Topics  . Smoking status: Current Some Day Smoker -- 0.10 packs/day for 14 years    Types: Cigarettes  . Smokeless tobacco: Never Used  . Alcohol Use: 1.0 oz/week    2 drink(s) per week     Comment: Drink3-4 times/week  . Drug Use: No     Comment: sometimes  . Sexual Activity: Yes    Partners: Male     Comment: pt. declined condoms   Other Topics Concern  . None   Social History Narrative   Patient is currently in school for cosmetology license    FAMILY HISTORY:  We obtained a detailed, 4-generation family history.  Significant diagnoses are listed below: Family  History  Problem Relation Age of Onset  . Colon cancer Mother     Cancer gene; onset around age 21  . Brain cancer Brother 10    located at the back of his skull  . Breast cancer Paternal Grandmother 47    dbl mastectomy  . Lung cancer Maternal Grandfather     dx in his 35s and then again in his 74s  . Colon polyps Sister     Genetic testing: PMS2 sequencing revealed deleterious mutation, but EPCAM, MLH1, MSH2 and MSH6 sequencing all normal  . COPD Paternal Grandfather    Patient's maternal ancestors are of [place] and [place] descent, and paternal ancestors are of [place] and [place] descent. There is no reported Ashkenazi Jewish ancestry. There is no known consanguinity.  GENETIC COUNSELING ASSESSMENT: James Jensen is a 27 y.o. male with a personal history of colon polyps and family history of colon cancer and Lynch Syndrome and a predisposition to cancer. We, therefore, discussed and recommended the following at today's visit.   DISCUSSION: We reviewed the characteristics, features and inheritance patterns of hereditary cancer syndromes. We also discussed genetic testing, including the appropriate family members to test, the process of testing, insurance coverage and turn-around-time for results. We discussed Lynch syndrome and the associated  cancers based on PMS2 testing.  He is at 50% risk for having the same mutation found in his sister.  We discussed that it would be good to see his mother's genetic test results as it is surprising that there is a lack of Lynch syndrome cancers on his dad's side of the family, and taht his mother had colon cancer and was negative.  We also reviewed, that in individuals with brain tumors at young ages, there is an increased risk for more than one mutation running in the family.  James Jensen voiced his concern, specifically for gastric cancer.  Based on his brother's brain tumor at age 35, and James Jensen's concern for gastric cancer, we will order full Lynch syndrome  testing, rather than single site.  PLAN: After considering the risks, benefits, and limitations, James Jensen provided informed consent to pursue genetic testing and the blood sample will be sent to [facility] for analysis of the Colaris test for Lynch syndrome and MYH. We discussed the implications of a positive, negative and/ or variant of uncertain significance genetic test result. Results should be available within approximately 3-4 weeks' time, at which point they will be disclosed by telephone to James Jensen, as will any additional recommendations warranted by these results. James Jensen will receive a summary of his genetic counseling visit and a copy of his results once available. This information will also be available in Epic. We encouraged James Jensen to remain in contact with cancer genetics annually so that we can continuously update the family history and inform him of any changes in cancer genetics and testing that may be of benefit for his family. James Jensen James Jensen's questions were answered to his satisfaction today. Our contact information was provided should additional questions or concerns arise.  The patient was seen for a total of 60 minutes, greater than 50% of which was spent face-to-face counseling.  This note will also be sent to the referring provider via the electronic medical record. The patient will be supplied with a summary of this genetic counseling discussion as well as educational information on the discussed hereditary cancer syndromes following the conclusion of their visit.   Patient was discussed with Dr. Marcy Panning.   _______________________________________________________________________ For Office Staff:  Number of people involved in session: 1 Was an Intern/ student involved with case: no

## 2013-08-13 ENCOUNTER — Other Ambulatory Visit: Payer: Self-pay | Admitting: Internal Medicine

## 2013-08-13 DIAGNOSIS — B20 Human immunodeficiency virus [HIV] disease: Secondary | ICD-10-CM

## 2013-08-22 ENCOUNTER — Telehealth: Payer: Self-pay | Admitting: Genetic Counselor

## 2013-08-22 NOTE — Telephone Encounter (Signed)
Left good news message on VM.  Asked that he call back and left phone number.

## 2013-08-26 ENCOUNTER — Telehealth: Payer: Self-pay | Admitting: Genetic Counselor

## 2013-08-26 NOTE — Telephone Encounter (Signed)
Revealed negative Lynch syndrome and MYH testing.

## 2013-08-26 NOTE — Telephone Encounter (Signed)
Left good news message on VM. 

## 2013-08-27 ENCOUNTER — Encounter: Payer: Self-pay | Admitting: Genetic Counselor

## 2013-10-14 ENCOUNTER — Encounter: Payer: Self-pay | Admitting: Internal Medicine

## 2013-10-14 ENCOUNTER — Ambulatory Visit (INDEPENDENT_AMBULATORY_CARE_PROVIDER_SITE_OTHER): Payer: No Typology Code available for payment source | Admitting: Internal Medicine

## 2013-10-14 VITALS — BP 150/69 | HR 64 | Temp 98.7°F | Wt 157.0 lb

## 2013-10-14 DIAGNOSIS — Z800 Family history of malignant neoplasm of digestive organs: Secondary | ICD-10-CM

## 2013-10-14 DIAGNOSIS — Z113 Encounter for screening for infections with a predominantly sexual mode of transmission: Secondary | ICD-10-CM | POA: Insufficient documentation

## 2013-10-14 DIAGNOSIS — B20 Human immunodeficiency virus [HIV] disease: Secondary | ICD-10-CM

## 2013-10-14 DIAGNOSIS — F329 Major depressive disorder, single episode, unspecified: Secondary | ICD-10-CM

## 2013-10-14 DIAGNOSIS — Z79899 Other long term (current) drug therapy: Secondary | ICD-10-CM

## 2013-10-14 DIAGNOSIS — F4321 Adjustment disorder with depressed mood: Secondary | ICD-10-CM

## 2013-10-14 LAB — CBC WITH DIFFERENTIAL/PLATELET
Basophils Absolute: 0 10*3/uL (ref 0.0–0.1)
Basophils Relative: 1 % (ref 0–1)
Eosinophils Absolute: 0.1 10*3/uL (ref 0.0–0.7)
Eosinophils Relative: 2 % (ref 0–5)
HCT: 41.9 % (ref 39.0–52.0)
Hemoglobin: 14.8 g/dL (ref 13.0–17.0)
Lymphocytes Relative: 39 % (ref 12–46)
Lymphs Abs: 1.4 10*3/uL (ref 0.7–4.0)
MCH: 31.2 pg (ref 26.0–34.0)
MCHC: 35.3 g/dL (ref 30.0–36.0)
MCV: 88.2 fL (ref 78.0–100.0)
Monocytes Absolute: 0.3 10*3/uL (ref 0.1–1.0)
Monocytes Relative: 8 % (ref 3–12)
Neutro Abs: 1.8 10*3/uL (ref 1.7–7.7)
Neutrophils Relative %: 50 % (ref 43–77)
Platelets: 215 10*3/uL (ref 150–400)
RBC: 4.75 MIL/uL (ref 4.22–5.81)
RDW: 13.7 % (ref 11.5–15.5)
WBC: 3.5 10*3/uL — ABNORMAL LOW (ref 4.0–10.5)

## 2013-10-14 NOTE — Assessment & Plan Note (Signed)
Negative work up.  °

## 2013-10-14 NOTE — Assessment & Plan Note (Signed)
He is not currently having any active depression and not interested in counseling.

## 2013-10-14 NOTE — Progress Notes (Signed)
  Subjective:    Patient ID: James PartridgeKelly Jensen, male    DOB: [birth year], 28 y.o.   MRN: [id number]  HPI  He comes in for followup of his HIV. He was started on Stribild in 2013 and developed an undetectable viral load and good CD4 count. His most recent CD4 count 680. He has had no problems with the medications and feels well. No nausea, no weight loss, no lymphadenopathy or rashes.  He is seeing IM in [place] for other issues and had his cholesterol monitored.     He did see the genetic counselor for Lynch syndrome and discussed with Dr. Welton FlakesKhan and results were negative.     Review of Systems  Constitutional: Negative for unexpected weight change.  HENT: Negative for sore throat and trouble swallowing.   Gastrointestinal: Negative for nausea and diarrhea.  Skin: Negative for rash.  Neurological: Negative for dizziness, light-headedness and headaches.  Hematological: Negative for adenopathy.  Psychiatric/Behavioral: Negative for dysphoric mood.       Objective:   Physical Exam  Constitutional: He appears well-developed and well-nourished. No distress.  HENT:  Mouth/Throat: No oropharyngeal exudate.  Eyes: No scleral icterus.  Cardiovascular: Normal rate, regular rhythm and normal heart sounds.   No murmur heard. Pulmonary/Chest: Effort normal and breath sounds normal. No respiratory distress. He has no wheezes.  Lymphadenopathy:    He has no cervical adenopathy.  Skin: No rash noted.  Psychiatric: He has a normal mood and affect.          Assessment & Plan:

## 2013-10-14 NOTE — Assessment & Plan Note (Signed)
Doing great.  Labs today. If ok, rtc 6 months.    He is possiblygetting a new job that will require travel for months at a time so he is going to check with insurance on getting 3 month supplies of meds and I will provide.  He also will change appts sooner or later if he travels.

## 2013-10-15 LAB — COMPLETE METABOLIC PANEL WITH GFR
ALT: 16 U/L (ref 0–53)
AST: 18 U/L (ref 0–37)
Albumin: 4.3 g/dL (ref 3.5–5.2)
Alkaline Phosphatase: 59 U/L (ref 39–117)
BUN: 10 mg/dL (ref 6–23)
CO2: 32 mEq/L (ref 19–32)
Calcium: 9.1 mg/dL (ref 8.4–10.5)
Chloride: 102 mEq/L (ref 96–112)
Creat: 0.96 mg/dL (ref 0.50–1.35)
GFR, Est African American: >89 mL/min
GFR, Est Non African American: >89 mL/min
Glucose, Bld: 68 mg/dL — ABNORMAL LOW (ref 70–99)
Potassium: 4.2 mEq/L (ref 3.5–5.3)
Sodium: 141 mEq/L (ref 135–145)
Total Bilirubin: 0.6 mg/dL (ref 0.2–1.2)
Total Protein: 7.1 g/dL (ref 6.0–8.3)

## 2013-10-15 LAB — T-HELPER CELL (CD4) - (RCID CLINIC ONLY)
CD4 % Helper T Cell: 44 % (ref 33–55)
CD4 T Cell Abs: 630 /uL (ref 400–2700)

## 2013-10-15 LAB — HIV-1 RNA QUANT-NO REFLEX-BLD
HIV 1 RNA Quant: <20 copies/mL (ref <20)
HIV-1 RNA Quant, Log: <1.3 {Log} (ref <1.30)

## 2013-10-15 LAB — LIPID PANEL
Cholesterol: 143 mg/dL (ref 0–200)
HDL: 85 mg/dL (ref >39)
LDL Cholesterol: 49 mg/dL (ref 0–99)
Total CHOL/HDL Ratio: 1.7 Ratio
Triglycerides: 43 mg/dL (ref <150)
VLDL: 9 mg/dL (ref 0–40)

## 2013-10-15 LAB — URINE CYTOLOGY ANCILLARY ONLY
Chlamydia: NEGATIVE
Neisseria Gonorrhea: NEGATIVE

## 2013-10-15 LAB — RPR

## 2013-11-11 ENCOUNTER — Other Ambulatory Visit: Payer: Self-pay | Admitting: Internal Medicine

## 2014-03-12 ENCOUNTER — Other Ambulatory Visit: Payer: Self-pay | Admitting: Internal Medicine

## 2014-04-03 ENCOUNTER — Other Ambulatory Visit: Payer: No Typology Code available for payment source

## 2014-04-03 DIAGNOSIS — B20 Human immunodeficiency virus [HIV] disease: Secondary | ICD-10-CM

## 2014-04-03 LAB — CBC WITH DIFFERENTIAL/PLATELET
Basophils Absolute: 0 10*3/uL (ref 0.0–0.1)
Basophils Relative: 1 % (ref 0–1)
Eosinophils Absolute: 0.1 10*3/uL (ref 0.0–0.7)
Eosinophils Relative: 3 % (ref 0–5)
HCT: 41.2 % (ref 39.0–52.0)
Hemoglobin: 14.4 g/dL (ref 13.0–17.0)
Lymphocytes Relative: 42 % (ref 12–46)
Lymphs Abs: 1.7 10*3/uL (ref 0.7–4.0)
MCH: 31.6 pg (ref 26.0–34.0)
MCHC: 35 g/dL (ref 30.0–36.0)
MCV: 90.5 fL (ref 78.0–100.0)
Monocytes Absolute: 0.5 10*3/uL (ref 0.1–1.0)
Monocytes Relative: 12 % (ref 3–12)
Neutro Abs: 1.7 10*3/uL (ref 1.7–7.7)
Neutrophils Relative %: 42 % — ABNORMAL LOW (ref 43–77)
Platelets: 205 10*3/uL (ref 150–400)
RBC: 4.55 MIL/uL (ref 4.22–5.81)
RDW: 13.2 % (ref 11.5–15.5)
WBC: 4 10*3/uL (ref 4.0–10.5)

## 2014-04-03 LAB — COMPREHENSIVE METABOLIC PANEL
ALT: 16 U/L (ref 0–53)
AST: 22 U/L (ref 0–37)
Albumin: 4.2 g/dL (ref 3.5–5.2)
Alkaline Phosphatase: 76 U/L (ref 39–117)
BUN: 8 mg/dL (ref 6–23)
CO2: 26 mEq/L (ref 19–32)
Calcium: 9.2 mg/dL (ref 8.4–10.5)
Chloride: 102 mEq/L (ref 96–112)
Creat: 1.07 mg/dL (ref 0.50–1.35)
Glucose, Bld: 82 mg/dL (ref 70–99)
Potassium: 4 mEq/L (ref 3.5–5.3)
Sodium: 138 mEq/L (ref 135–145)
Total Bilirubin: 0.6 mg/dL (ref 0.2–1.2)
Total Protein: 7 g/dL (ref 6.0–8.3)

## 2014-04-04 ENCOUNTER — Other Ambulatory Visit: Payer: Self-pay | Admitting: *Deleted

## 2014-04-04 DIAGNOSIS — Z113 Encounter for screening for infections with a predominantly sexual mode of transmission: Secondary | ICD-10-CM

## 2014-04-04 LAB — T-HELPER CELL (CD4) - (RCID CLINIC ONLY)
CD4 % Helper T Cell: 37 % (ref 33–55)
CD4 T Cell Abs: 690 /uL (ref 400–2700)

## 2014-04-07 ENCOUNTER — Other Ambulatory Visit: Payer: No Typology Code available for payment source

## 2014-04-07 DIAGNOSIS — Z113 Encounter for screening for infections with a predominantly sexual mode of transmission: Secondary | ICD-10-CM

## 2014-04-07 LAB — HIV-1 RNA QUANT-NO REFLEX-BLD
HIV 1 RNA Quant: <20 copies/mL (ref <20)
HIV-1 RNA Quant, Log: <1.3 {Log} (ref <1.30)

## 2014-04-08 LAB — RPR

## 2014-04-08 LAB — URINE CYTOLOGY ANCILLARY ONLY
Chlamydia: NEGATIVE
Neisseria Gonorrhea: NEGATIVE

## 2014-04-17 ENCOUNTER — Ambulatory Visit: Payer: No Typology Code available for payment source | Admitting: Internal Medicine

## 2014-05-12 ENCOUNTER — Other Ambulatory Visit: Payer: Self-pay | Admitting: Internal Medicine

## 2014-05-12 DIAGNOSIS — B20 Human immunodeficiency virus [HIV] disease: Secondary | ICD-10-CM

## 2014-05-12 NOTE — Telephone Encounter (Signed)
Pt needs 6815-month follow-up appt with Dr. Luciana Axeomer.

## 2014-05-20 ENCOUNTER — Ambulatory Visit (INDEPENDENT_AMBULATORY_CARE_PROVIDER_SITE_OTHER): Payer: No Typology Code available for payment source | Admitting: Internal Medicine

## 2014-05-20 ENCOUNTER — Encounter: Payer: Self-pay | Admitting: Internal Medicine

## 2014-05-20 VITALS — BP 119/73 | HR 69 | Temp 97.8°F | Ht 75.0 in | Wt 154.0 lb

## 2014-05-20 DIAGNOSIS — Z113 Encounter for screening for infections with a predominantly sexual mode of transmission: Secondary | ICD-10-CM

## 2014-05-20 DIAGNOSIS — Z202 Contact with and (suspected) exposure to infections with a predominantly sexual mode of transmission: Secondary | ICD-10-CM | POA: Insufficient documentation

## 2014-05-20 DIAGNOSIS — B20 Human immunodeficiency virus [HIV] disease: Secondary | ICD-10-CM

## 2014-05-20 MED ORDER — CEFTRIAXONE SODIUM 1 G IJ SOLR
250.0000 mg | Freq: Once | INTRAMUSCULAR | Status: AC
  Administered 2014-05-20: 250 mg via INTRAMUSCULAR

## 2014-05-20 NOTE — Assessment & Plan Note (Signed)
We'll give him on empiric dose of ceftriaxone for gonorrhea. We will also check his urine for chlamydia and call in a prescription if needed.

## 2014-05-20 NOTE — Progress Notes (Signed)
  Subjective:    Patient ID: James Jensen, male    DOB: 04-30-1986, 28 y.o.   MRN: [id number]  HPI He comes in for followup of his HIV. He was started on Stribild in 2013 and developed an undetectable viral load and good CD4 count. His most recent CD4 count 690. He has had no problems with the medications and feels well. No nausea, no weight loss, no lymphadenopathy or rashes.  He has not been back to his primary physician. He was recently exposed to somebody with gonorrhea with receptive anal intercourse.    Review of Systems  Constitutional: Negative for unexpected weight change.  HENT: Negative for sore throat and trouble swallowing.   Gastrointestinal: Negative for nausea and diarrhea.  Skin: Negative for rash.  Neurological: Negative for dizziness, light-headedness and headaches.  Hematological: Negative for adenopathy.  Psychiatric/Behavioral: Negative for dysphoric mood.       Objective:   Physical Exam  Constitutional: He appears well-developed and well-nourished. No distress.  HENT:  Mouth/Throat: No oropharyngeal exudate.  Eyes: No scleral icterus.  Cardiovascular: Normal rate, regular rhythm and normal heart sounds.   No murmur heard. Pulmonary/Chest: Effort normal and breath sounds normal. No respiratory distress.  Lymphadenopathy:    He has no cervical adenopathy.  Skin: No rash noted.          Assessment & Plan:

## 2014-05-21 LAB — URINE CYTOLOGY ANCILLARY ONLY
Chlamydia: NEGATIVE
Neisseria Gonorrhea: NEGATIVE

## 2014-06-23 ENCOUNTER — Telehealth: Payer: Self-pay | Admitting: *Deleted

## 2014-06-23 NOTE — Telephone Encounter (Signed)
Pt has questions about missing one dose of Stribild.  Unable to return message and answer question, voice mailbox full.

## 2014-07-09 ENCOUNTER — Other Ambulatory Visit: Payer: Self-pay

## 2014-07-09 ENCOUNTER — Other Ambulatory Visit: Payer: Self-pay | Admitting: Internal Medicine

## 2014-07-09 DIAGNOSIS — B20 Human immunodeficiency virus [HIV] disease: Secondary | ICD-10-CM

## 2014-07-09 MED ORDER — ELVITEG-COBIC-EMTRICIT-TENOFDF 150-150-200-300 MG PO TABS: 1.0000 | ORAL_TABLET | Freq: Every day | ORAL | Status: DC

## 2014-07-09 NOTE — Telephone Encounter (Signed)
Patient calling for refill of Stribild.  Medication sent to pharmacy   Jagar Lua K Bertina Guthridge,RN

## 2014-08-18 ENCOUNTER — Telehealth: Payer: Self-pay | Admitting: *Deleted

## 2014-08-18 NOTE — Telephone Encounter (Signed)
Patient was notified by the health department that he had been identified as a syphilis contact.  He went to the health department for testing (result was - ) 2/26, but received the "two shots" in his buttocks.  He is scheduled for follow up at the end of May for labs.  He would like to know if he should have labs before then, or if rechecking a RPR on 11/18/14 would be appropriate. Please advise. Andree CossHowell, Toron Bowring M, RN

## 2014-08-19 NOTE — Telephone Encounter (Signed)
RPR on 5/31 will be fine.

## 2014-11-06 ENCOUNTER — Other Ambulatory Visit: Payer: Self-pay | Admitting: Internal Medicine

## 2014-11-18 ENCOUNTER — Other Ambulatory Visit (INDEPENDENT_AMBULATORY_CARE_PROVIDER_SITE_OTHER): Payer: No Typology Code available for payment source

## 2014-11-18 DIAGNOSIS — B20 Human immunodeficiency virus [HIV] disease: Secondary | ICD-10-CM | POA: Diagnosis not present

## 2014-11-18 DIAGNOSIS — Z113 Encounter for screening for infections with a predominantly sexual mode of transmission: Secondary | ICD-10-CM

## 2014-11-19 LAB — T-HELPER CELL (CD4) - (RCID CLINIC ONLY)
CD4 % Helper T Cell: 42 % (ref 33–55)
CD4 T Cell Abs: 630 /uL (ref 400–2700)

## 2014-11-19 LAB — HIV-1 RNA QUANT-NO REFLEX-BLD
HIV 1 RNA Quant: <20 copies/mL (ref <20)
HIV-1 RNA Quant, Log: <1.3 {Log} (ref <1.30)

## 2014-11-19 LAB — RPR

## 2014-12-03 ENCOUNTER — Ambulatory Visit (INDEPENDENT_AMBULATORY_CARE_PROVIDER_SITE_OTHER): Payer: No Typology Code available for payment source | Admitting: Internal Medicine

## 2014-12-03 ENCOUNTER — Encounter: Payer: Self-pay | Admitting: Internal Medicine

## 2014-12-03 VITALS — BP 138/76 | HR 53 | Temp 98.4°F | Ht 75.0 in | Wt 160.0 lb

## 2014-12-03 DIAGNOSIS — B20 Human immunodeficiency virus [HIV] disease: Secondary | ICD-10-CM | POA: Diagnosis not present

## 2014-12-03 MED ORDER — ELVITEG-COBIC-EMTRICIT-TENOFAF 150-150-200-10 MG PO TABS: 1.0000 | ORAL_TABLET | Freq: Every day | ORAL | Status: DC

## 2014-12-03 NOTE — Progress Notes (Signed)
  Subjective:    Patient ID: James Jensen, male    DOB: 1986-06-14, 29 y.o.   MRN: [id number]  HPI He comes in for followup of his HIV. He was started on Stribild in 2013 and developed an undetectable viral load and good CD4 count. His most recent CD4 count 630 prior to this vist. He has had no problems with the medications and feels well. No nausea, no weight loss, no lymphadenopathy or rashes.  Uses condoms.      Review of Systems  Constitutional: Negative for unexpected weight change.  HENT: Negative for sore throat and trouble swallowing.   Gastrointestinal: Negative for nausea and diarrhea.  Genitourinary: Negative for discharge.  Skin: Negative for rash.  Neurological: Negative for dizziness, light-headedness and headaches.  Hematological: Negative for adenopathy.  Psychiatric/Behavioral: Negative for dysphoric mood.       Objective:   Physical Exam  Constitutional: He appears well-developed and well-nourished. No distress.  HENT:  Mouth/Throat: No oropharyngeal exudate.  Eyes: No scleral icterus.  Cardiovascular: Normal rate, regular rhythm and normal heart sounds.   No murmur heard. Pulmonary/Chest: Effort normal and breath sounds normal. No respiratory distress.  Lymphadenopathy:    He has no cervical adenopathy.  Skin: No rash noted.          Assessment & Plan:

## 2014-12-03 NOTE — Assessment & Plan Note (Signed)
I will have him change to [facility] for better long-term benefit. He will get this with his next refill. I did send in for a 90 day supply though he can do 30 day supplies if this is denied by his insurance. He will return in 3 months on the new regimen with labs prior to the visit.

## 2014-12-09 ENCOUNTER — Other Ambulatory Visit: Payer: Self-pay | Admitting: *Deleted

## 2014-12-09 MED ORDER — ELVITEG-COBIC-EMTRICIT-TENOFAF 150-150-200-10 MG PO TABS: 1.0000 | ORAL_TABLET | Freq: Every day | ORAL | Status: DC

## 2015-02-05 ENCOUNTER — Other Ambulatory Visit: Payer: Self-pay | Admitting: Internal Medicine

## 2015-02-19 ENCOUNTER — Other Ambulatory Visit: Payer: No Typology Code available for payment source

## 2015-02-19 DIAGNOSIS — B20 Human immunodeficiency virus [HIV] disease: Secondary | ICD-10-CM

## 2015-02-19 LAB — CBC WITH DIFFERENTIAL/PLATELET
Basophils Absolute: 0 10*3/uL (ref 0.0–0.1)
Basophils Relative: 1 % (ref 0–1)
Eosinophils Absolute: 0.1 10*3/uL (ref 0.0–0.7)
Eosinophils Relative: 3 % (ref 0–5)
HCT: 40.7 % (ref 39.0–52.0)
Hemoglobin: 14.5 g/dL (ref 13.0–17.0)
Lymphocytes Relative: 42 % (ref 12–46)
Lymphs Abs: 1.8 10*3/uL (ref 0.7–4.0)
MCH: 32.4 pg (ref 26.0–34.0)
MCHC: 35.6 g/dL (ref 30.0–36.0)
MCV: 91.1 fL (ref 78.0–100.0)
MPV: 8.9 fL (ref 8.6–12.4)
Monocytes Absolute: 0.4 10*3/uL (ref 0.1–1.0)
Monocytes Relative: 9 % (ref 3–12)
Neutro Abs: 1.9 10*3/uL (ref 1.7–7.7)
Neutrophils Relative %: 45 % (ref 43–77)
Platelets: 192 10*3/uL (ref 150–400)
RBC: 4.47 MIL/uL (ref 4.22–5.81)
RDW: 13.1 % (ref 11.5–15.5)
WBC: 4.3 10*3/uL (ref 4.0–10.5)

## 2015-02-19 LAB — COMPLETE METABOLIC PANEL WITH GFR
ALT: 20 U/L (ref 9–46)
AST: 21 U/L (ref 10–40)
Albumin: 4.2 g/dL (ref 3.6–5.1)
Alkaline Phosphatase: 70 U/L (ref 40–115)
BUN: 9 mg/dL (ref 7–25)
CO2: 27 mmol/L (ref 20–31)
Calcium: 9 mg/dL (ref 8.6–10.3)
Chloride: 103 mmol/L (ref 98–110)
Creat: 0.95 mg/dL (ref 0.60–1.35)
GFR, Est African American: >89 mL/min (ref >=60)
GFR, Est Non African American: >89 mL/min (ref >=60)
Glucose, Bld: 81 mg/dL (ref 65–99)
Potassium: 4.5 mmol/L (ref 3.5–5.3)
Sodium: 139 mmol/L (ref 135–146)
Total Bilirubin: 0.4 mg/dL (ref 0.2–1.2)
Total Protein: 7 g/dL (ref 6.1–8.1)

## 2015-02-20 LAB — HIV-1 RNA QUANT-NO REFLEX-BLD
HIV 1 RNA Quant: <20 copies/mL (ref <20)
HIV-1 RNA Quant, Log: <1.3 {Log} (ref <1.30)

## 2015-02-20 LAB — T-HELPER CELL (CD4) - (RCID CLINIC ONLY)
CD4 % Helper T Cell: 46 % (ref 33–55)
CD4 T Cell Abs: 860 /uL (ref 400–2700)

## 2015-03-05 ENCOUNTER — Ambulatory Visit (INDEPENDENT_AMBULATORY_CARE_PROVIDER_SITE_OTHER): Payer: No Typology Code available for payment source | Admitting: Internal Medicine

## 2015-03-05 ENCOUNTER — Encounter: Payer: Self-pay | Admitting: Internal Medicine

## 2015-03-05 VITALS — BP 116/74 | HR 84 | Temp 97.5°F | Wt 160.0 lb

## 2015-03-05 DIAGNOSIS — Z23 Encounter for immunization: Secondary | ICD-10-CM | POA: Diagnosis not present

## 2015-03-05 DIAGNOSIS — F4323 Adjustment disorder with mixed anxiety and depressed mood: Secondary | ICD-10-CM | POA: Insufficient documentation

## 2015-03-05 DIAGNOSIS — F329 Major depressive disorder, single episode, unspecified: Secondary | ICD-10-CM

## 2015-03-05 DIAGNOSIS — F4321 Adjustment disorder with depressed mood: Secondary | ICD-10-CM | POA: Diagnosis not present

## 2015-03-05 DIAGNOSIS — B20 Human immunodeficiency virus [HIV] disease: Secondary | ICD-10-CM | POA: Diagnosis not present

## 2015-03-05 NOTE — Assessment & Plan Note (Signed)
Some anxiety, has used friends xanax.  No SI and feels stable now.

## 2015-03-05 NOTE — Progress Notes (Signed)
  Subjective:    Patient ID: James Jensen, male    DOB: [birth year], 29 y.o.   MRN: [id number]  HPI He comes in for followup of his HIV. He was started on Stribild in 2013 and developed an undetectable viral load and good CD4 count. His most recent CD4 count 860 prior to this vist. He has had no problems with the medications and feels well. I tried to change him to [place] but denied by his insurance. No nausea, no weight loss, no lymphadenopathy or rashes.  Uses condoms.  New stable partner, also HIV positive, on Stribild.  Mother sick with cancer and causing some anxiety.    Review of Systems  Constitutional: Negative for unexpected weight change.  HENT: Negative for sore throat and trouble swallowing.   Gastrointestinal: Negative for nausea and diarrhea.  Genitourinary: Negative for discharge.  Skin: Negative for rash.  Neurological: Negative for dizziness, light-headedness and headaches.  Hematological: Negative for adenopathy.  Psychiatric/Behavioral: Negative for dysphoric mood.       Objective:   Physical Exam  Constitutional: He appears well-developed and well-nourished. No distress.  HENT:  Mouth/Throat: No oropharyngeal exudate.  Eyes: No scleral icterus.  Cardiovascular: Normal rate, regular rhythm and normal heart sounds.   No murmur heard. Pulmonary/Chest: Effort normal and breath sounds normal. No respiratory distress.  Lymphadenopathy:    He has no cervical adenopathy.  Skin: No rash noted.          Assessment & Plan:

## 2015-03-05 NOTE — Assessment & Plan Note (Signed)
Doing great, rtc 6 months.  Discussed condoms and STIs.

## 2015-03-11 ENCOUNTER — Telehealth: Payer: Self-pay | Admitting: *Deleted

## 2015-03-11 ENCOUNTER — Other Ambulatory Visit: Payer: Self-pay | Admitting: *Deleted

## 2015-03-11 ENCOUNTER — Other Ambulatory Visit: Payer: Self-pay | Admitting: Internal Medicine

## 2015-03-11 MED ORDER — ELVITEG-COBIC-EMTRICIT-TENOFDF 150-150-200-300 MG PO TABS: ORAL_TABLET | ORAL | Status: DC

## 2015-03-11 NOTE — Telephone Encounter (Signed)
Pt is requesting a short-term, low-dose prescription for Xanax.  Pt's mother is anticipated to die in 4 weeks due to cancer diagnosis.  MD please advise.

## 2015-03-12 NOTE — Telephone Encounter (Signed)
I do not prescribe xanax or benzos.  He can see Bernette Redbird for grief counseling or [facility].

## 2015-06-23 ENCOUNTER — Telehealth: Payer: Self-pay | Admitting: *Deleted

## 2015-06-23 DIAGNOSIS — B20 Human immunodeficiency virus [HIV] disease: Secondary | ICD-10-CM

## 2015-06-23 NOTE — Telephone Encounter (Signed)
PA needed for Stribild rx.   [employer] contacted, [phone].  Pt has [employer] of [place], tel  # is [phone].

## 2015-06-30 ENCOUNTER — Encounter: Payer: Self-pay | Admitting: Family Medicine

## 2015-07-01 MED ORDER — ELVITEG-COBIC-EMTRICIT-TENOFAF 150-150-200-10 MG PO TABS: 1.0000 | ORAL_TABLET | Freq: Every day | ORAL | Status: DC

## 2015-07-01 NOTE — Telephone Encounter (Signed)
Pt's insurance, [employer]BS, pt must use mail order specialty pharmacy.  Obtained medication change order from Dr. Luciana Axeomer.  Change pt from St[facility]ild to [facility] per Dr. Luciana Axeomer.  [place] up with [facility] [place]

## 2015-07-10 ENCOUNTER — Ambulatory Visit (INDEPENDENT_AMBULATORY_CARE_PROVIDER_SITE_OTHER): Payer: BLUE CROSS/BLUE SHIELD | Admitting: Family Medicine

## 2015-07-10 ENCOUNTER — Encounter: Payer: Self-pay | Admitting: Family Medicine

## 2015-07-10 VITALS — BP 116/70 | HR 64 | Ht 76.0 in | Wt 160.4 lb

## 2015-07-10 DIAGNOSIS — K635 Polyp of colon: Secondary | ICD-10-CM | POA: Diagnosis not present

## 2015-07-10 DIAGNOSIS — Z0000 Encounter for general adult medical examination without abnormal findings: Secondary | ICD-10-CM

## 2015-07-10 DIAGNOSIS — Z113 Encounter for screening for infections with a predominantly sexual mode of transmission: Secondary | ICD-10-CM | POA: Diagnosis not present

## 2015-07-10 DIAGNOSIS — A630 Anogenital (venereal) warts: Secondary | ICD-10-CM

## 2015-07-10 DIAGNOSIS — Z800 Family history of malignant neoplasm of digestive organs: Secondary | ICD-10-CM

## 2015-07-10 DIAGNOSIS — B20 Human immunodeficiency virus [HIV] disease: Secondary | ICD-10-CM | POA: Diagnosis not present

## 2015-07-10 DIAGNOSIS — Z720 Tobacco use: Secondary | ICD-10-CM | POA: Diagnosis not present

## 2015-07-10 LAB — POCT URINALYSIS DIPSTICK
Bilirubin, UA: NEGATIVE
Blood, UA: NEGATIVE
Glucose, UA: NEGATIVE
Ketones, UA: NEGATIVE
Leukocytes, UA: NEGATIVE
Nitrite, UA: NEGATIVE
Protein, UA: NEGATIVE
Spec Grav, UA: >=1.03
Urobilinogen, UA: NEGATIVE
pH, UA: 6

## 2015-07-10 LAB — LIPID PANEL
Cholesterol: 178 mg/dL (ref 125–200)
HDL: 107 mg/dL (ref >=40)
LDL Cholesterol: 52 mg/dL (ref <130)
Total CHOL/HDL Ratio: 1.7 Ratio (ref <=5.0)
Triglycerides: 96 mg/dL (ref <150)
VLDL: 19 mg/dL (ref <30)

## 2015-07-10 NOTE — Addendum Note (Signed)
Addended by: Herminio Commons A on: 07/10/2015 03:10 PM   Modules accepted: Orders

## 2015-07-10 NOTE — Progress Notes (Signed)
Subjective:    Patient ID: James Jensen, male    DOB: November 24, 1985, 30 y.o.   MRN: [id number]  HPI He is here for a complete examination. He does have underlying HIV disease and is being followed at the ID clinic. He plans to continue his care there. He does have a family history of colon cancer and has all ready had a colonoscopy which did show multiple polyps. He apparently is on a 3 year cycle for this. There is also a family history of Lynch syndrome. He is also had difficulty with anal warts in the past and has a lesion that he would like me to look at. Recently his mother died from colon cancer. He is still having some bereavement issues dealing with this but seems to be handling this fairly well overall.He would like to be STD tested to be safe as he is sexually active and does occasionally not use protection. He otherwise has no particular concerns or complaints. Family and social history as well as health maintenance and immunizations was reviewed.   Review of Systems  All other systems reviewed and are negative.      Objective:   Physical Exam BP 116/70 mmHg  Pulse 64  Ht  (1.93 m)  Wt 160 lb 6.4 oz (72.757 kg)  BMI 19.53 kg/m2  General Appearance:    Alert, cooperative, no distress, appears stated age  Head:    Normocephalic, without obvious abnormality, atraumatic  Eyes:    PERRL, conjunctiva/corneas clear, EOM's intact, fundi    benign  Ears:    Normal TM's and external ear canals  Nose:   Nares normal, mucosa normal, no drainage or sinus   tenderness  Throat:   Lips, mucosa, and tongue normal; teeth and gums normal  Neck:   Supple, no lymphadenopathy;  thyroid:  no   enlargement/tenderness/nodules; no carotid   bruit or JVD  Back:    Spine nontender, no curvature, ROM normal, no CVA     tenderness  Lungs:     Clear to auscultation bilaterally without wheezes, rales or     ronchi; respirations unlabored  Chest Wall:    No tenderness or deformity   Heart:    Regular  rate and rhythm, S1 and S2 normal, no murmur, rub   or gallop  Breast Exam:    No chest wall tenderness, masses or gynecomastia  Abdomen:     Soft, non-tender, nondistended, normoactive bowel sounds,    no masses, no hepatosplenomegaly  Genitalia:    Normal male external genitalia without lesions.  Testicles without masses.  No inguinal hernias.  Rectal:   Exam showed no lesions however a small perirectal skin tag versus wart was noted. It was approximately 1-2 mm in size.  Extremities:   No clubbing, cyanosis or edema  Pulses:   2+ and symmetric all extremities  Skin:   Skin color, texture, turgor normal, no rashes or lesions  Lymph nodes:   Cervical, supraclavicular, and axillary nodes normal  Neurologic:   CNII-XII intact, normal strength, sensation and gait; reflexes 2+ and symmetric throughout          Psych:   Normal mood, affect, hygiene and grooming.          Assessment & Plan:  Routine general medical examination at a health care facility  Colon polyps - Plan: Lipid panel  HIV disease (HCC)  Family history of Lynch syndrome  Screening examination for venereal disease - Plan: GC/chlamydia probe amp, urine,  RPR, CANCELED: GC/chlamydia probe amp, urine, CANCELED: RPR  Warts, genital  Family history of colon cancer  Occasional cigarette smoker The rectal lesion was injected with Xylocaine and hyfrecated without difficulty.Discussed the need for him to always practice safe sex. Briefly discussed his smoking but since he is doing that only minimally did not waste too much time on this. We will seek to get his colonoscopy report from [facility].

## 2015-07-11 LAB — RPR

## 2015-07-11 LAB — GC/CHLAMYDIA PROBE AMP
CT Probe RNA: NOT DETECTED
GC Probe RNA: NOT DETECTED

## 2015-08-13 ENCOUNTER — Encounter: Payer: Self-pay | Admitting: Family Medicine

## 2015-08-19 ENCOUNTER — Other Ambulatory Visit: Payer: BLUE CROSS/BLUE SHIELD

## 2015-08-19 DIAGNOSIS — Z23 Encounter for immunization: Secondary | ICD-10-CM

## 2015-08-19 DIAGNOSIS — B20 Human immunodeficiency virus [HIV] disease: Secondary | ICD-10-CM

## 2015-08-19 DIAGNOSIS — Z113 Encounter for screening for infections with a predominantly sexual mode of transmission: Secondary | ICD-10-CM

## 2015-08-20 ENCOUNTER — Telehealth: Payer: Self-pay | Admitting: Family Medicine

## 2015-08-20 LAB — HIV-1 RNA QUANT-NO REFLEX-BLD
HIV 1 RNA Quant: <20 copies/mL (ref <20)
HIV-1 RNA Quant, Log: <1.3 Log copies/mL (ref <1.30)

## 2015-08-20 LAB — T-HELPER CELL (CD4) - (RCID CLINIC ONLY)
CD4 % Helper T Cell: 43 % (ref 33–55)
CD4 T Cell Abs: 770 /uL (ref 400–2700)

## 2015-08-20 LAB — RPR

## 2015-08-20 NOTE — Telephone Encounter (Signed)
Records received from [facility] endoscopy. Sending back for review.

## 2015-08-21 LAB — URINE CYTOLOGY ANCILLARY ONLY
Chlamydia: NEGATIVE
Neisseria Gonorrhea: NEGATIVE

## 2015-08-24 ENCOUNTER — Other Ambulatory Visit: Payer: No Typology Code available for payment source

## 2015-08-31 ENCOUNTER — Encounter: Payer: Self-pay | Admitting: Family Medicine

## 2015-09-01 ENCOUNTER — Encounter: Payer: Self-pay | Admitting: Internal Medicine

## 2015-09-01 ENCOUNTER — Ambulatory Visit (INDEPENDENT_AMBULATORY_CARE_PROVIDER_SITE_OTHER): Payer: BLUE CROSS/BLUE SHIELD | Admitting: Internal Medicine

## 2015-09-01 ENCOUNTER — Ambulatory Visit: Payer: No Typology Code available for payment source | Admitting: Internal Medicine

## 2015-09-01 ENCOUNTER — Ambulatory Visit: Payer: BLUE CROSS/BLUE SHIELD | Admitting: *Deleted

## 2015-09-01 VITALS — BP 139/84 | HR 64 | Temp 97.6°F | Wt 163.0 lb

## 2015-09-01 DIAGNOSIS — F4321 Adjustment disorder with depressed mood: Secondary | ICD-10-CM | POA: Diagnosis not present

## 2015-09-01 DIAGNOSIS — Z113 Encounter for screening for infections with a predominantly sexual mode of transmission: Secondary | ICD-10-CM

## 2015-09-01 DIAGNOSIS — Z79899 Other long term (current) drug therapy: Secondary | ICD-10-CM | POA: Diagnosis not present

## 2015-09-01 DIAGNOSIS — B20 Human immunodeficiency virus [HIV] disease: Secondary | ICD-10-CM | POA: Diagnosis not present

## 2015-09-01 DIAGNOSIS — F329 Major depressive disorder, single episode, unspecified: Secondary | ICD-10-CM

## 2015-09-01 NOTE — Assessment & Plan Note (Signed)
Doing well.  rtc 6 months.  

## 2015-09-01 NOTE — BH Specialist Note (Signed)
Counselor met with James Jensen in the exam room today to inquire about emotional status.  Patient has a history of mental health issues specifically depression.  Patient was oriented times four with good affect and dress.  Patient was alert and talkative.  Patient indicated that he was getting stronger and stronger since his mother passed away.  Patient shared that he has been staying busy and trying to distract himself from thinking too much about his mother.  Patient stated that he did not need services right now but would take a business card if and when he did.  Counselor provided support and encouragement accordingly.   Rolena Infante, MA [facility]

## 2015-09-01 NOTE — Progress Notes (Signed)
  Subjective:    Patient ID: James Jensen, male    DOB: 09-25-1985, 30 y.o.   MRN: [id number]  HPI He comes in for followup of his HIV.  He was started on Stribild in 2013 and developed an undetectable viral load and good CD4 count. His most recent CD4 count 770 prior to this vist and a continued undetectable vl. He has had no problems with the medications and feels well. I tried to change him to [facility] but denied by his insurance initially and now approved and on it. No nausea, no weight loss, no lymphadenopathy or rashes.  Uses condoms.  New potential partner but not yet sexually active with him.  He is negative so interested in giving info on PrEP.  Mother die recently die of cancer.    Review of Systems  Constitutional: Negative for unexpected weight change.  HENT: Negative for sore throat and trouble swallowing.   Gastrointestinal: Negative for nausea and diarrhea.  Genitourinary: Negative for discharge.  Skin: Negative for rash.  Neurological: Negative for dizziness, light-headedness and headaches.  Hematological: Negative for adenopathy.  Psychiatric/Behavioral: Negative for dysphoric mood.       Objective:   Physical Exam  Constitutional: He appears well-developed and well-nourished. No distress.  HENT:  Mouth/Throat: No oropharyngeal exudate.  Eyes: No scleral icterus.  Cardiovascular: Normal rate, regular rhythm and normal heart sounds.   No murmur heard. Pulmonary/Chest: Effort normal and breath sounds normal. No respiratory distress.  Lymphadenopathy:    He has no cervical adenopathy.  Skin: No rash noted.          Assessment & Plan:

## 2015-09-01 NOTE — Assessment & Plan Note (Signed)
Counseled by our counselor here.  Some mood swings but does have people to talk to.

## 2015-09-09 ENCOUNTER — Encounter: Payer: Self-pay | Admitting: Family Medicine

## 2015-12-25 ENCOUNTER — Other Ambulatory Visit: Payer: Self-pay | Admitting: Internal Medicine

## 2016-03-03 ENCOUNTER — Ambulatory Visit (HOSPITAL_COMMUNITY)
Admission: EM | Admit: 2016-03-03 | Discharge: 2016-03-03 | Disposition: A | Discharge status: Home / Self Care | Payer: BLUE CROSS/BLUE SHIELD | Attending: Internal Medicine | Admitting: Internal Medicine

## 2016-03-03 ENCOUNTER — Ambulatory Visit (INDEPENDENT_AMBULATORY_CARE_PROVIDER_SITE_OTHER): Payer: BLUE CROSS/BLUE SHIELD

## 2016-03-03 ENCOUNTER — Encounter (HOSPITAL_COMMUNITY): Payer: Self-pay | Admitting: Emergency Medicine

## 2016-03-03 DIAGNOSIS — R0789 Other chest pain: Secondary | ICD-10-CM

## 2016-03-03 MED ORDER — DICLOFENAC POTASSIUM 50 MG PO TABS: 50.0000 mg | ORAL_TABLET | Freq: Three times a day (TID) | ORAL | 0 refills | Status: DC

## 2016-03-03 NOTE — ED Notes (Signed)
I was only able to obtain the PA view due to the patient's condition, in attempting to do the lateral view of the chest, patient slumped, wasn't standing erect, I attempted to help him raise the right arm, he kept repeating that he could not breath, his right arm was hurting and that was why he was here for the pain in his chest and couldn't breath. I went immediately to notify clinical staff of the situation before I went to get a wheel chair because he was slumping and I thought there was a possibility that he was going to fall.
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
## 2016-03-03 NOTE — ED Provider Notes (Signed)
CSN: [id number]     Arrival date & time 03/03/16  1029 History   First MD Initiated Contact with Patient 03/03/16 1239     Chief Complaint  Patient presents with  . Chest Pain   (Consider location/radiation/quality/duration/timing/severity/associated sxs/prior Treatment) 30 year old male complaining of tightness in his right upper chest and right posterior shoulder that began yesterday. Tightness more so today which is primarily located in the shoulder.. The pain hurts when taking a deep breath and movement. Denies fever or cough. He has a history of HIV and STDs, smoking and abrasion.  Denies heaviness, tightness, fullness, pressure or squeezing sensation within the chest.  It is noted by the radiology technician that when attempting to perform a PA view the patient attempted to raise the arm but it was painful and  he tended to slump forward. A couple minutes later when asked how he felt he states he felt well. Denied complaints at that time.       Past Medical History:  Diagnosis Date  . Colon polyps   . Depression    [facility]   . Family history of Lynch syndrome    sister has PMS2 mutation  . H/O suicide attempt    as teenager.  "Cutting" Depression r/t being a" closet" gay teen   . HIV infection ([place])    History reviewed. No pertinent surgical history. Family History  Problem Relation Age of Onset  . Colon cancer Mother     Cancer gene; onset around age 73  . Brain cancer Brother 10    located at the back of his skull  . Breast cancer Paternal Grandmother 65    dbl mastectomy  . Lung cancer Maternal Grandfather     dx in his 71s and then again in his 73s  . Colon polyps Sister     Genetic testing: PMS2 sequencing revealed deleterious mutation, but EPCAM, MLH1, MSH2 and MSH6 sequencing all normal  . COPD Paternal Grandfather    Social History  Substance Use Topics  . Smoking status: Current Some Day Smoker    Packs/day: 0.10    Years: 14.00   Types: Cigarettes  . Smokeless tobacco: Never Used  . Alcohol use 1.2 oz/week    2 Standard drinks or equivalent per week     Comment: Drink3-4 times/week    Review of Systems  Constitutional: Positive for activity change. Negative for fever.  HENT: Negative for congestion, ear pain, hearing loss, postnasal drip, sore throat and trouble swallowing.   Eyes: Negative.   Respiratory: Positive for chest tightness and shortness of breath. Negative for cough and wheezing.   Cardiovascular: Positive for chest pain. Negative for palpitations.  Gastrointestinal: Negative.   Genitourinary: Negative.   Musculoskeletal: Positive for arthralgias and myalgias. Negative for gait problem and joint swelling.  Skin: Negative.   Neurological: Negative.   All other systems reviewed and are negative.   Allergies  Other  Home Medications   Prior to Admission medications   Medication Sig Start Date End Date Taking? Authorizing Provider  diclofenac (CATAFLAM) 50 MG tablet Take 1 tablet (50 mg total) by mouth 3 (three) times daily. One tablet TID with food prn pain. 03/03/16   Janne Napoleon, NP  GENVOYA 150-150-200-10 MG TABS tablet TAKE 1 TABLET BY MOUTH DAILY WITH BREAKFAST 12/25/15   Thayer Headings, MD  ibuprofen (ADVIL,MOTRIN) 200 MG tablet Take 200-400 mg by mouth every 6 (six) hours as needed.    Historical Provider, MD  Meds Ordered and Administered this Visit  Medications - No data to display  BP 110/55 (BP Location: Left Arm)   Pulse 88   Temp 98.5 F (36.9 C) (Oral)   Resp 22   SpO2 99%  No data found.   Physical Exam  Constitutional: He is oriented to person, place, and time. He appears well-developed and well-nourished. No distress.  HENT:  Head: Normocephalic and atraumatic.  Neck: Normal range of motion. Neck supple.  Cardiovascular: Normal rate, regular rhythm, normal heart sounds and intact distal pulses.   Pulmonary/Chest: Effort normal and breath sounds normal. No respiratory  distress. He has no wheezes. He has no rales. He exhibits no tenderness.  Lungs are clear. No adventitious sounds. Palpation of the right anterior chest does not reproduce pain. Palpation of the right posterior shoulder musculature does not produce pain however he states it makes it feel tight. No increase in effort. Tachypnea.  Musculoskeletal: He exhibits no edema.  No tenderness to the right anterior chest wall. There is tenderness to the right anterior shoulder joint. Internal and external passive range of motion produces joint line pain. He states this pain is different from the one in which he presents. Abduction of the right arm is limited to 100 secondary to pain in the right upper back and anterior chest.  Neurological: He is alert and oriented to person, place, and time. He has normal reflexes.  Skin: Skin is warm and dry.  Psychiatric: Thought content normal. His speech is delayed. He is slowed. He exhibits a depressed mood.  Nursing note and vitals reviewed.   Urgent Care Course   Clinical Course    Procedures (including critical care time)  Labs Review Labs Reviewed - No data to display  Imaging Review Dg Chest 2 View  Result Date: 03/03/2016 CLINICAL DATA:  Right upper pleuritic chest pain and right lower neck pain EXAM: CHEST  2 VIEW COMPARISON:  None. FINDINGS: No active infiltrate or effusion is seen. No pneumothorax is noted. Mediastinal and hilar contours are unremarkable. The heart is within normal limits in size. No bony abnormality is seen. IMPRESSION: No active cardiopulmonary disease. Electronically Signed   By: Ivar Drape M.D.   On: 03/03/2016 13:16     Visual Acuity Review  Right Eye Distance:   Left Eye Distance:   Bilateral Distance:    Right Eye Near:   Left Eye Near:    Bilateral Near:         MDM   1. Chest wall pain   2. Musculoskeletal chest pain   Suspect the behavior of the patient reflects  depressive affect. When taking the pain  medication Cataflam make sure you drink plenty of fluids. Drink at least one glass of water when taking the medicine. May also want to have something on your stomach as well. May apply heat to the areas of pain. If you develop fever, shortness of breath, cough or other symptoms sick medical attention promptly. May return or go to the emergency department. Call your primary care doctor today or tomorrow to make a follow-up appointment.     Janne Napoleon, NP 03/03/16 1339

## 2016-03-03 NOTE — Discharge Instructions (Signed)
When taking the pain medication Cataflam make sure you drink plenty of fluids. Drink at least one glass of water when taking the medicine. May also want to have something on your stomach as well. May apply heat to the areas of pain. If you develop fever, shortness of breath, cough or other symptoms sick medical attention promptly. May return or go to the emergency department. Call your primary care doctor today or tomorrow to make a follow-up appointment.

## 2016-04-25 ENCOUNTER — Other Ambulatory Visit: Payer: BLUE CROSS/BLUE SHIELD

## 2016-04-27 ENCOUNTER — Other Ambulatory Visit: Payer: BLUE CROSS/BLUE SHIELD

## 2016-04-27 DIAGNOSIS — B20 Human immunodeficiency virus [HIV] disease: Secondary | ICD-10-CM

## 2016-04-27 LAB — COMPLETE METABOLIC PANEL WITH GFR
ALT: 11 U/L (ref 9–46)
AST: 16 U/L (ref 10–40)
Albumin: 4.2 g/dL (ref 3.6–5.1)
Alkaline Phosphatase: 55 U/L (ref 40–115)
BUN: 11 mg/dL (ref 7–25)
CO2: 28 mmol/L (ref 20–31)
Calcium: 9.2 mg/dL (ref 8.6–10.3)
Chloride: 105 mmol/L (ref 98–110)
Creat: 0.9 mg/dL (ref 0.60–1.35)
GFR, Est African American: >89 mL/min (ref >=60)
GFR, Est Non African American: >89 mL/min (ref >=60)
Glucose, Bld: 87 mg/dL (ref 65–99)
Potassium: 4.5 mmol/L (ref 3.5–5.3)
Sodium: 141 mmol/L (ref 135–146)
Total Bilirubin: 0.4 mg/dL (ref 0.2–1.2)
Total Protein: 6.7 g/dL (ref 6.1–8.1)

## 2016-04-27 LAB — CBC WITH DIFFERENTIAL/PLATELET
Basophils Absolute: 42 cells/uL (ref 0–200)
Basophils Relative: 1 %
Eosinophils Absolute: 84 cells/uL (ref 15–500)
Eosinophils Relative: 2 %
HCT: 42.1 % (ref 38.5–50.0)
Hemoglobin: 14.3 g/dL (ref 13.2–17.1)
Lymphocytes Relative: 44 %
Lymphs Abs: 1848 cells/uL (ref 850–3900)
MCH: 31.8 pg (ref 27.0–33.0)
MCHC: 34 g/dL (ref 32.0–36.0)
MCV: 93.8 fL (ref 80.0–100.0)
MPV: 9.5 fL (ref 7.5–12.5)
Monocytes Absolute: 378 cells/uL (ref 200–950)
Monocytes Relative: 9 %
Neutro Abs: 1848 cells/uL (ref 1500–7800)
Neutrophils Relative %: 44 %
Platelets: 206 10*3/uL (ref 140–400)
RBC: 4.49 MIL/uL (ref 4.20–5.80)
RDW: 13.3 % (ref 11.0–15.0)
WBC: 4.2 10*3/uL (ref 3.8–10.8)

## 2016-04-28 LAB — HIV-1 RNA QUANT-NO REFLEX-BLD
HIV 1 RNA Quant: <20 copies/mL (ref <20)
HIV-1 RNA Quant, Log: <1.3 Log copies/mL (ref <1.30)

## 2016-04-28 LAB — T-HELPER CELL (CD4) - (RCID CLINIC ONLY)
CD4 % Helper T Cell: 43 % (ref 33–55)
CD4 T Cell Abs: 700 /uL (ref 400–2700)

## 2016-05-16 ENCOUNTER — Ambulatory Visit (INDEPENDENT_AMBULATORY_CARE_PROVIDER_SITE_OTHER): Payer: BLUE CROSS/BLUE SHIELD | Admitting: Internal Medicine

## 2016-05-16 ENCOUNTER — Encounter: Payer: Self-pay | Admitting: Internal Medicine

## 2016-05-16 VITALS — BP 137/83 | HR 60 | Temp 98.1°F | Wt 157.0 lb

## 2016-05-16 DIAGNOSIS — Z23 Encounter for immunization: Secondary | ICD-10-CM | POA: Diagnosis not present

## 2016-05-16 DIAGNOSIS — F329 Major depressive disorder, single episode, unspecified: Secondary | ICD-10-CM

## 2016-05-16 DIAGNOSIS — B20 Human immunodeficiency virus [HIV] disease: Secondary | ICD-10-CM | POA: Diagnosis not present

## 2016-05-16 NOTE — Assessment & Plan Note (Signed)
Doing great.  rtc 6 months.  

## 2016-05-16 NOTE — Progress Notes (Signed)
  Subjective:    Patient ID: James PartridgeKelly Jensen, male    DOB: 08-19-1985, 30 y.o.   MRN: [id number]  HPI He comes in for followup of his HIV.  He was started on Stribild in 2013 and developed an undetectable viral load and good CD4 count. His most recent CD4 count 770 prior to this vist and a continued undetectable vl. He has had no problems with the medications and feels well. I tried to change him to [place]oya but denied by his insurance initially and now approved and on it. No nausea, no weight loss, no lymphadenopathy or rashes.  Uses condoms.  New partner but not yet sexually active with him.  He is negative so considering PrEP.  Some depression but has good social support so no issues.    Review of Systems  Constitutional: Negative for unexpected weight change.  HENT: Negative for sore throat and trouble swallowing.   Gastrointestinal: Negative for diarrhea and nausea.  Genitourinary: Negative for discharge.  Skin: Negative for rash.  Neurological: Negative for dizziness, light-headedness and headaches.  Hematological: Negative for adenopathy.  Psychiatric/Behavioral: Negative for dysphoric mood.       Objective:   Physical Exam  Constitutional: He appears well-developed and well-nourished. No distress.  HENT:  Mouth/Throat: No oropharyngeal exudate.  Eyes: No scleral icterus.  Cardiovascular: Normal rate, regular rhythm and normal heart sounds.   No murmur heard. Pulmonary/Chest: Effort normal and breath sounds normal. No respiratory distress.  Lymphadenopathy:    He has no cervical adenopathy.  Skin: No rash noted.          Assessment & Plan:

## 2016-05-16 NOTE — Assessment & Plan Note (Addendum)
Stable now and no SI.  Offered counseling.

## 2016-05-20 ENCOUNTER — Other Ambulatory Visit: Payer: Self-pay | Admitting: Internal Medicine

## 2016-06-08 LAB — HM COLONOSCOPY

## 2016-07-18 ENCOUNTER — Encounter: Payer: Self-pay | Admitting: Family Medicine

## 2016-07-18 ENCOUNTER — Ambulatory Visit (INDEPENDENT_AMBULATORY_CARE_PROVIDER_SITE_OTHER): Payer: BLUE CROSS/BLUE SHIELD | Admitting: Family Medicine

## 2016-07-18 VITALS — BP 116/70 | HR 73 | Ht 75.0 in | Wt 161.0 lb

## 2016-07-18 DIAGNOSIS — Z800 Family history of malignant neoplasm of digestive organs: Secondary | ICD-10-CM

## 2016-07-18 DIAGNOSIS — Z113 Encounter for screening for infections with a predominantly sexual mode of transmission: Secondary | ICD-10-CM

## 2016-07-18 DIAGNOSIS — Z0000 Encounter for general adult medical examination without abnormal findings: Secondary | ICD-10-CM

## 2016-07-18 DIAGNOSIS — K635 Polyp of colon: Secondary | ICD-10-CM

## 2016-07-18 DIAGNOSIS — B20 Human immunodeficiency virus [HIV] disease: Secondary | ICD-10-CM | POA: Diagnosis not present

## 2016-07-18 NOTE — Progress Notes (Signed)
   Subjective:    Patient ID: James Jensen, male    DOB: 25-Oct-1985, 31 y.o.   MRN: [id number]  HPI He is here for complete examination. His main complaint today is he has noticing that he tends to drop items. He does work as a Interior and spatial designerhairdresser and so far has not caused any major problems. He does have a previous history of CTS and does remember getting injections which did help. This was over 10 years ago. He continues on his HIV medication. He did have unprotected anal sex last night but states that his partner did take Truvada. He continues on [place]Genvoya. He gets regular follow-up concerning that. Review of the record indicates a family history of Lynch syndrome. He has started an exercise program. Rarely smokes and has noted a decrease in his alcohol consumption. He does wear his seatbelt.   Review of Systems  All other systems reviewed and are negative.      Objective:   Physical Exam BP 116/70   Pulse 73   Ht 6\' 3"  (1.905 m)   Wt 161 lb (73 kg)   SpO2 98%   BMI 20.12 kg/m   General Appearance:    Alert, cooperative, no distress, appears stated age  Head:    Normocephalic, without obvious abnormality, atraumatic  Eyes:    PERRL, conjunctiva/corneas clear, EOM's intact, fundi    benign  Ears:    Normal TM's and external ear canals  Nose:   Nares normal, mucosa normal, no drainage or sinus   tenderness  Throat:   Lips, mucosa, and tongue normal; teeth and gums normal  Neck:   Supple, no lymphadenopathy;  thyroid:  no   enlargement/tenderness/nodules; no carotid   bruit or JVD  Back:    Spine nontender, no curvature, ROM normal, no CVA     tenderness  Lungs:     Clear to auscultation bilaterally without wheezes, rales or     ronchi; respirations unlabored      Heart:    Regular rate and rhythm, S1 and S2 normal, no murmur, rub   or gallop     Abdomen:     Soft, non-tender, nondistended, normoactive bowel sounds,    no masses, no hepatosplenomegaly  Genitalia:    Normal male external  genitalia without lesions.  Testicles without masses.  No inguinal hernias.  Rectal:   Deferred due to age <40 and lack of symptoms  Extremities:   No clubbing, cyanosis or edema  Pulses:   2+ and symmetric all extremities  Skin:   Skin color, texture, turgor normal, no rashes or lesions  Lymph nodes:   Cervical, supraclavicular, and axillary nodes normal  Neurologic:   CNII-XII intact, normal strength, sensation and gait; reflexes 2+ and symmetric throughout          Psych:   Normal mood, affect, hygiene and grooming.          Assessment & Plan:  Polyp of colon, unspecified part of colon, unspecified type  HIV disease (HCC)  Family history of Lynch syndrome  Screening examination for venereal disease  Routine general medical examination at a health care facility  Family history of colon cancer Cautioned him on lack of protected sex even know he is on meds and his most recent sexual partner was as well. I will get him back in 2 weeks and do STD testing on him and will also check his lipid panel. Encouraged him to continue with his exercise pattern.

## 2016-08-03 ENCOUNTER — Other Ambulatory Visit: Payer: BLUE CROSS/BLUE SHIELD

## 2016-08-03 DIAGNOSIS — Z79899 Other long term (current) drug therapy: Secondary | ICD-10-CM

## 2016-08-03 DIAGNOSIS — B20 Human immunodeficiency virus [HIV] disease: Secondary | ICD-10-CM

## 2016-08-03 LAB — LIPID PANEL
Cholesterol: 187 mg/dL (ref <200)
HDL: 101 mg/dL (ref >40)
LDL Cholesterol: 74 mg/dL (ref <100)
Total CHOL/HDL Ratio: 1.9 Ratio (ref <5.0)
Triglycerides: 61 mg/dL (ref <150)
VLDL: 12 mg/dL (ref <30)

## 2016-08-04 LAB — RPR

## 2016-08-05 LAB — HIV 1/2 CONFIRMATION
HIV-1 antibody: POSITIVE — AB
HIV-2 Ab: NEGATIVE

## 2016-08-05 LAB — HIV ANTIBODY (ROUTINE TESTING W REFLEX): HIV 1&2 Ab, 4th Generation: REACTIVE — AB

## 2016-08-10 LAB — HIV-1 RNA QUANT-NO REFLEX-BLD
HIV 1 RNA Quant: <20 copies/mL
HIV-1 RNA Quant, Log: <1.3 Log copies/mL

## 2016-10-11 ENCOUNTER — Other Ambulatory Visit: Payer: Self-pay | Admitting: Internal Medicine

## 2016-10-21 ENCOUNTER — Ambulatory Visit (INDEPENDENT_AMBULATORY_CARE_PROVIDER_SITE_OTHER): Payer: BLUE CROSS/BLUE SHIELD | Admitting: Family Medicine

## 2016-10-21 ENCOUNTER — Encounter: Payer: Self-pay | Admitting: Family Medicine

## 2016-10-21 VITALS — BP 110/70 | HR 74 | Wt 165.0 lb

## 2016-10-21 DIAGNOSIS — B20 Human immunodeficiency virus [HIV] disease: Secondary | ICD-10-CM | POA: Diagnosis not present

## 2016-10-21 DIAGNOSIS — Z113 Encounter for screening for infections with a predominantly sexual mode of transmission: Secondary | ICD-10-CM

## 2016-10-21 DIAGNOSIS — Z79899 Other long term (current) drug therapy: Secondary | ICD-10-CM | POA: Diagnosis not present

## 2016-10-21 LAB — CBC WITH DIFFERENTIAL/PLATELET
Basophils Absolute: 0 {cells}/uL (ref 0–200)
Basophils Relative: 0 %
Eosinophils Absolute: 90 {cells}/uL (ref 15–500)
Eosinophils Relative: 2 %
HCT: 42.5 % (ref 38.5–50.0)
Hemoglobin: 14.4 g/dL (ref 13.2–17.1)
Lymphocytes Relative: 34 %
Lymphs Abs: 1530 {cells}/uL (ref 850–3900)
MCH: 31.6 pg (ref 27.0–33.0)
MCHC: 33.9 g/dL (ref 32.0–36.0)
MCV: 93.2 fL (ref 80.0–100.0)
MPV: 9.6 fL (ref 7.5–12.5)
Monocytes Absolute: 540 {cells}/uL (ref 200–950)
Monocytes Relative: 12 %
Neutro Abs: 2340 {cells}/uL (ref 1500–7800)
Neutrophils Relative %: 52 %
Platelets: 214 K/uL (ref 140–400)
RBC: 4.56 MIL/uL (ref 4.20–5.80)
RDW: 13 % (ref 11.0–15.0)
WBC: 4.5 K/uL (ref 4.0–10.5)

## 2016-10-21 NOTE — Progress Notes (Signed)
   Subjective:    Patient ID: James PartridgeKelly Jensen, male    DOB: 1985-09-30, 31 y.o.   MRN: [id number]  HPI He is here for consult concerning possible STD exposure. He is now in a new relationship and both of them plan to get tested. He has had no dysuria, discharge, skin lesions. He continues on Genvoya and is taking this regularly. He is scheduled to follow-up with infectious disease later in the month.   Review of Systems     Objective:   Physical Exam Alert and in no distress otherwise not examined       Assessment & Plan:  Screening examination for venereal disease - Plan: GC/Chlamydia Probe Amp, GC/Chlamydia Probe Amp, RPR  HIV disease (HCC) - Plan: CBC with Differential/Platelet, Comprehensive metabolic panel, Lipid panel, HIV 1 RNA quant-no reflex-bld, T-helper cells (CD4) count ([facility])  Encounter for long-term (current) use of medications - Plan: CBC with Differential/Platelet, Comprehensive metabolic panel, Lipid panel, HIV 1 RNA quant-no reflex-bld, T-helper cells (CD4) count [facility]) Throat and urine will be tested for GC/chlamydia. Blood was also ordered in preparation for him to be seen in infectious disease clinic.

## 2016-10-22 LAB — GC/CHLAMYDIA PROBE AMP
CT Probe RNA: NOT DETECTED
GC Probe RNA: NOT DETECTED

## 2016-10-22 LAB — LIPID PANEL
Cholesterol: 199 mg/dL (ref <200)
HDL: 115 mg/dL (ref >40)
LDL Cholesterol: 75 mg/dL (ref <100)
Total CHOL/HDL Ratio: 1.7 Ratio (ref <5.0)
Triglycerides: 46 mg/dL (ref <150)
VLDL: 9 mg/dL (ref <30)

## 2016-10-22 LAB — COMPREHENSIVE METABOLIC PANEL
ALT: 15 U/L (ref 9–46)
AST: 20 U/L (ref 10–40)
Albumin: 4.1 g/dL (ref 3.6–5.1)
Alkaline Phosphatase: 54 U/L (ref 40–115)
BUN: 9 mg/dL (ref 7–25)
CO2: 23 mmol/L (ref 20–31)
Calcium: 8.9 mg/dL (ref 8.6–10.3)
Chloride: 103 mmol/L (ref 98–110)
Creat: 0.9 mg/dL (ref 0.60–1.35)
Glucose, Bld: 91 mg/dL (ref 65–99)
Potassium: 4.4 mmol/L (ref 3.5–5.3)
Sodium: 137 mmol/L (ref 135–146)
Total Bilirubin: 0.5 mg/dL (ref 0.2–1.2)
Total Protein: 6.8 g/dL (ref 6.1–8.1)

## 2016-10-24 LAB — T-HELPER CELLS (CD4) COUNT (NOT AT ARMC)
Absolute CD4: 741 cells/uL (ref 490–1740)
CD4 T Helper %: 48 % (ref 30–61)
Total lymphocyte count: 1558 cells/uL (ref 850–3900)

## 2016-10-25 ENCOUNTER — Other Ambulatory Visit: Payer: BLUE CROSS/BLUE SHIELD

## 2016-10-25 LAB — HIV-1 RNA QUANT-NO REFLEX-BLD
HIV 1 RNA Quant: <20 copies/mL
HIV-1 RNA Quant, Log: <1.3 Log copies/mL

## 2016-10-25 NOTE — Addendum Note (Signed)
Addended by: Minette HeadlandBENFIELD, Brianna Bennett L on: 10/25/2016 04:48 PM   Modules accepted: Orders

## 2016-10-28 LAB — GC/CHLAMYDIA PROBE, AMP (THROAT)
Chlamydia trachomatis RNA: NOT DETECTED
Neisseria gonorrhoeae RNA: NOT DETECTED

## 2016-10-31 ENCOUNTER — Other Ambulatory Visit: Payer: Self-pay

## 2016-11-15 ENCOUNTER — Ambulatory Visit: Payer: Self-pay | Admitting: Internal Medicine

## 2016-12-06 ENCOUNTER — Encounter: Payer: Self-pay | Admitting: Internal Medicine

## 2016-12-06 ENCOUNTER — Ambulatory Visit (INDEPENDENT_AMBULATORY_CARE_PROVIDER_SITE_OTHER): Payer: BLUE CROSS/BLUE SHIELD | Admitting: Internal Medicine

## 2016-12-06 VITALS — BP 141/83 | HR 64 | Temp 97.9°F | Ht 75.0 in | Wt 158.0 lb

## 2016-12-06 DIAGNOSIS — B20 Human immunodeficiency virus [HIV] disease: Secondary | ICD-10-CM | POA: Diagnosis not present

## 2016-12-06 DIAGNOSIS — Z23 Encounter for immunization: Secondary | ICD-10-CM | POA: Diagnosis not present

## 2016-12-06 DIAGNOSIS — Z113 Encounter for screening for infections with a predominantly sexual mode of transmission: Secondary | ICD-10-CM

## 2016-12-06 NOTE — Assessment & Plan Note (Signed)
Doing well on medication.  Can rtc 6 months.

## 2016-12-06 NOTE — Assessment & Plan Note (Signed)
Will check with next blood draw.

## 2016-12-06 NOTE — Addendum Note (Signed)
Addended by: Wendall MolaOCKERHAM, Darsi Tien A on: 12/06/2016 04:28 PM   Modules accepted: Orders

## 2016-12-06 NOTE — Progress Notes (Signed)
   Subjective:    Patient ID: James PartridgeKelly Jensen, male    DOB: Nov 17, 1985, 31 y.o.   MRN: [id number]  HPI Here for follow up of HIV Continues on Genvoya after changing from Stribild.  No missed doses, feels well.  Had his labs checked with other bloodwork last month with Dr. Susann GivensLalonde and look great. CD4 1558, viral load remains < 20.  No asociated n/v/d.  GC, Chlamydia negative.    Review of Systems  Constitutional: Negative for fatigue.  Skin: Negative for rash.  Neurological: Negative for dizziness.       Objective:   Physical Exam  Constitutional: He appears well-developed and well-nourished. No distress.  Eyes: No scleral icterus.  Cardiovascular: Normal rate, regular rhythm and normal heart sounds.   No murmur heard. Pulmonary/Chest: Effort normal and breath sounds normal. No respiratory distress.  Lymphadenopathy:    He has no cervical adenopathy.  Skin: No rash noted.          Assessment & Plan:

## 2017-01-04 ENCOUNTER — Telehealth: Payer: Self-pay

## 2017-01-04 NOTE — Telephone Encounter (Signed)
Per [facility] [facility]- refer pt to [facility] for dilated pupil. Pt scheduled at 2:15 with Dr. Dione BoozeGroat- pt aware./ RLB

## 2017-01-05 ENCOUNTER — Ambulatory Visit: Payer: BLUE CROSS/BLUE SHIELD | Admitting: Family Medicine

## 2017-03-06 ENCOUNTER — Other Ambulatory Visit: Payer: Self-pay | Admitting: Internal Medicine

## 2017-04-13 ENCOUNTER — Ambulatory Visit (INDEPENDENT_AMBULATORY_CARE_PROVIDER_SITE_OTHER): Payer: BLUE CROSS/BLUE SHIELD | Admitting: Family Medicine

## 2017-04-13 ENCOUNTER — Encounter: Payer: Self-pay | Admitting: Family Medicine

## 2017-04-13 VITALS — BP 132/74 | HR 86 | Wt 157.2 lb

## 2017-04-13 DIAGNOSIS — R201 Hypoesthesia of skin: Secondary | ICD-10-CM

## 2017-04-13 DIAGNOSIS — Z113 Encounter for screening for infections with a predominantly sexual mode of transmission: Secondary | ICD-10-CM

## 2017-04-13 NOTE — Progress Notes (Signed)
   Subjective:    Patient ID: James Jensen, male    DOB: [birth year], 31 y.o.   MRN: [id number]  HPI He is here for multiple concerns. He notes a one-week history of a numb feeling over the anterior left shin area. No history of injury to that area or to the knee. No back pain, numbness or tingling. No weakness. He also recently broke up with his boyfriend and would like to be STD tested. No sore throat, urinary discharge, rectal discomfort.   Review of Systems     Objective:   Physical Exam exam of the left calf area does show recent trauma unrelated to his symptoms. Decreased sensation noted over the anterolateral shin area. Full motion of the hip and knee. Negative straight leg raising. DTRs normal. Strength normal.      Assessment & Plan:  Screening examination for venereal disease - Plan: RPR, C. trachomatis/N. gonorrhoeae RNA, C. trachomatis/N. gonorrhoeae RNA  Hypesthesia I explained that most likely he has had some trauma to the cutaneous nerve to that area but I do not see any cause for major concern. Recommend conservative care. Will also do routine screening on him concerning STDs.

## 2017-04-14 LAB — TIQ-NTM

## 2017-04-14 LAB — RPR: RPR Ser Ql: NONREACTIVE

## 2017-04-14 LAB — C. TRACHOMATIS/N. GONORRHOEAE RNA
C. trachomatis RNA, TMA: NOT DETECTED
N. gonorrhoeae RNA, TMA: NOT DETECTED

## 2017-04-18 LAB — GONOCOCCUS CULTURE
MICRO NUMBER:: 81208076
SPECIMEN QUALITY:: ADEQUATE

## 2017-04-18 LAB — C. TRACHOMATIS/N. GONORRHOEAE RNA

## 2017-04-18 LAB — TEST AUTHORIZATION

## 2017-05-25 ENCOUNTER — Other Ambulatory Visit: Payer: BLUE CROSS/BLUE SHIELD

## 2017-06-08 ENCOUNTER — Other Ambulatory Visit (HOSPITAL_COMMUNITY)
Admission: RE | Admit: 2017-06-08 | Discharge: 2017-06-08 | Disposition: A | Discharge status: Home / Self Care | Payer: BLUE CROSS/BLUE SHIELD | Source: Ambulatory Visit | Attending: Internal Medicine | Admitting: Internal Medicine

## 2017-06-08 ENCOUNTER — Encounter: Payer: Self-pay | Admitting: Internal Medicine

## 2017-06-08 ENCOUNTER — Ambulatory Visit (INDEPENDENT_AMBULATORY_CARE_PROVIDER_SITE_OTHER): Payer: BLUE CROSS/BLUE SHIELD | Admitting: Internal Medicine

## 2017-06-08 VITALS — BP 121/75 | HR 57 | Temp 98.2°F | Ht 75.0 in | Wt 157.0 lb

## 2017-06-08 DIAGNOSIS — A749 Chlamydial infection, unspecified: Secondary | ICD-10-CM | POA: Insufficient documentation

## 2017-06-08 DIAGNOSIS — Z113 Encounter for screening for infections with a predominantly sexual mode of transmission: Secondary | ICD-10-CM | POA: Diagnosis present

## 2017-06-08 DIAGNOSIS — Z23 Encounter for immunization: Secondary | ICD-10-CM | POA: Diagnosis not present

## 2017-06-08 DIAGNOSIS — Z720 Tobacco use: Secondary | ICD-10-CM | POA: Insufficient documentation

## 2017-06-08 DIAGNOSIS — B20 Human immunodeficiency virus [HIV] disease: Secondary | ICD-10-CM

## 2017-06-08 NOTE — Assessment & Plan Note (Signed)
Counseled to avoid

## 2017-06-08 NOTE — Assessment & Plan Note (Signed)
Doing well.  Labs today and rtc 6 months unless concerns.  

## 2017-06-08 NOTE — Assessment & Plan Note (Signed)
Will screen today 

## 2017-06-08 NOTE — Progress Notes (Signed)
   Subjective:    Patient ID: James PartridgeKelly Defeo, male    DOB: [birth year], 31 y.o.   MRN: [id number]  HPI Here for follow up of HIV Taking Genvoya and denies any missed doses. No associated n/v/d.  Recent negative STI screening.  Asking for screening again with recent sexual activity. Uses condoms.  No other concerns.    Review of Systems  Constitutional: Negative for fatigue.  Gastrointestinal: Negative for diarrhea.  Skin: Negative for rash.       Objective:   Physical Exam  Constitutional: He appears well-developed and well-nourished. No distress.  Eyes: No scleral icterus.  Cardiovascular: Normal rate, regular rhythm and normal heart sounds.  No murmur heard. Pulmonary/Chest: Effort normal and breath sounds normal. No respiratory distress.  Lymphadenopathy:    He has no cervical adenopathy.  Skin: No rash noted.   SH: occasional tobacco       Assessment & Plan:

## 2017-06-09 LAB — URINE CYTOLOGY ANCILLARY ONLY
Chlamydia: POSITIVE — AB
Neisseria Gonorrhea: NEGATIVE

## 2017-06-09 LAB — CYTOLOGY, (ORAL, ANAL, URETHRAL) ANCILLARY ONLY
Chlamydia: NEGATIVE
Chlamydia: POSITIVE — AB
Neisseria Gonorrhea: NEGATIVE
Neisseria Gonorrhea: NEGATIVE

## 2017-06-09 LAB — T-HELPER CELL (CD4) - (RCID CLINIC ONLY)
CD4 % Helper T Cell: 43 % (ref 33–55)
CD4 T Cell Abs: 1050 /uL (ref 400–2700)

## 2017-06-09 LAB — RPR: RPR Ser Ql: NONREACTIVE

## 2017-06-10 LAB — HIV-1 RNA QUANT-NO REFLEX-BLD
HIV 1 RNA Quant: <20 copies/mL
HIV-1 RNA Quant, Log: <1.3 Log copies/mL

## 2017-06-21 ENCOUNTER — Other Ambulatory Visit: Payer: Self-pay | Admitting: Internal Medicine

## 2017-06-21 ENCOUNTER — Encounter: Payer: Self-pay | Admitting: *Deleted

## 2017-06-21 ENCOUNTER — Telehealth: Payer: Self-pay | Admitting: *Deleted

## 2017-06-21 MED ORDER — AZITHROMYCIN 500 MG PO TABS: 1000.0000 mg | ORAL_TABLET | Freq: Once | ORAL | 0 refills | Status: AC

## 2017-06-21 NOTE — Telephone Encounter (Signed)
-----   Message from Gardiner Barefootobert W Comer, MD sent at 06/21/2017  7:25 AM EST ----- Looks like this was missed during the holidays.  He is positive for chlamydia.  Needs 1 gram of azithromycin.  I will send it in if you could let him know.  thanks

## 2017-06-21 NOTE — Telephone Encounter (Addendum)
Left generic message on voicemail asking patient to call back for results. Also sent MyChart message. Andree CossHowell, Barb Shear M, RN

## 2017-07-21 ENCOUNTER — Telehealth: Payer: Self-pay | Admitting: Family Medicine

## 2017-07-21 MED ORDER — AZITHROMYCIN 500 MG PO TABS: ORAL_TABLET | ORAL | 0 refills | Status: DC

## 2017-07-21 NOTE — Telephone Encounter (Signed)
Pt called and left voicemail  and states that he needs to talk to you he did not say for what called and left him a voicemail  informed  him that you had already left that I would still send you a message, pt can be reached at [phone]

## 2017-08-28 ENCOUNTER — Encounter: Payer: BLUE CROSS/BLUE SHIELD | Admitting: Family Medicine

## 2017-09-28 ENCOUNTER — Encounter: Payer: BLUE CROSS/BLUE SHIELD | Admitting: Family Medicine

## 2017-10-05 ENCOUNTER — Other Ambulatory Visit: Payer: Self-pay | Admitting: Internal Medicine

## 2017-11-07 ENCOUNTER — Ambulatory Visit: Payer: BLUE CROSS/BLUE SHIELD | Admitting: Family Medicine

## 2017-11-07 ENCOUNTER — Encounter: Payer: Self-pay | Admitting: Family Medicine

## 2017-11-07 VITALS — BP 110/76 | HR 65 | Temp 97.6°F | Ht 74.0 in | Wt 160.6 lb

## 2017-11-07 DIAGNOSIS — Z79899 Other long term (current) drug therapy: Secondary | ICD-10-CM | POA: Diagnosis not present

## 2017-11-07 DIAGNOSIS — Z800 Family history of malignant neoplasm of digestive organs: Secondary | ICD-10-CM

## 2017-11-07 DIAGNOSIS — F40243 Fear of flying: Secondary | ICD-10-CM | POA: Diagnosis not present

## 2017-11-07 DIAGNOSIS — Z23 Encounter for immunization: Secondary | ICD-10-CM

## 2017-11-07 DIAGNOSIS — Z113 Encounter for screening for infections with a predominantly sexual mode of transmission: Secondary | ICD-10-CM

## 2017-11-07 DIAGNOSIS — M461 Sacroiliitis, not elsewhere classified: Secondary | ICD-10-CM | POA: Diagnosis not present

## 2017-11-07 DIAGNOSIS — B20 Human immunodeficiency virus [HIV] disease: Secondary | ICD-10-CM | POA: Diagnosis not present

## 2017-11-07 DIAGNOSIS — K635 Polyp of colon: Secondary | ICD-10-CM | POA: Diagnosis not present

## 2017-11-07 DIAGNOSIS — Z0000 Encounter for general adult medical examination without abnormal findings: Secondary | ICD-10-CM

## 2017-11-07 LAB — POCT URINALYSIS DIP (PROADVANTAGE DEVICE)
Bilirubin, UA: NEGATIVE
Blood, UA: NEGATIVE
Glucose, UA: NEGATIVE mg/dL
Ketones, POC UA: NEGATIVE mg/dL
Leukocytes, UA: NEGATIVE
Nitrite, UA: NEGATIVE
Protein Ur, POC: NEGATIVE mg/dL
Specific Gravity, Urine: 1.03
Urobilinogen, Ur: 3.5
pH, UA: 6 (ref 5.0–8.0)

## 2017-11-07 MED ORDER — ALPRAZOLAM 0.25 MG PO TABS: 0.2500 mg | ORAL_TABLET | Freq: Two times a day (BID) | ORAL | 0 refills | Status: DC | PRN

## 2017-11-07 NOTE — Progress Notes (Signed)
Subjective:    Patient ID: James Jensen, male    DOB: 1986-01-25, 32 y.o.   MRN: [id number]  HPI He is here for complete examination.  He does have underlying HIV and is being followed by ID.  He does have an appointment scheduled in June.  He has had difficulty with chlamydia and was treated appropriately.  He did have symptoms after that and I retreated him.  He would like to be retested.  He also has been having some right posterior hip pain but no history of injury.  No numbness tingling or weakness.  He can push on the area which he says helps with the discomfort.  He also has a trip planned to [place] and does have difficulty with flying.  He has used Xanax in the past and would like more.  He also has a history of colonic polyps and family history of Lynch syndrome.  He has had colonoscopy within the last several years.  Psychologically he seems to be doing okay. Family and social history as well as health maintenance and immunizations were reviewed.  He is getting ready to potentially switch to a new position which he thinks will be quite stressful but he is taking a good attitude towards this.  Review of Systems  All other systems reviewed and are negative.      Objective:   Physical Exam BP 110/76 (BP Location: Left Arm, Patient Position: Sitting)   Pulse 65   Temp 97.6 F (36.4 C)   Ht  (1.88 m)   Wt 160 lb 9.6 oz (72.8 kg)   SpO2 98%   BMI 20.62 kg/m   General Appearance:    Alert, cooperative, no distress, appears stated age  Head:    Normocephalic, without obvious abnormality, atraumatic  Eyes:    PERRL, conjunctiva/corneas clear, EOM's intact, fundi    benign  Ears:    Normal TM's and external ear canals  Nose:   Nares normal, mucosa normal, no drainage or sinus   tenderness  Throat:   Lips, mucosa, and tongue normal; teeth and gums normal  Neck:   Supple, no lymphadenopathy;  thyroid:  no   enlargement/tenderness/nodules; no carotid   bruit or JVD     Lungs:      Clear to auscultation bilaterally without wheezes, rales or     ronchi; respirations unlabored      Heart:    Regular rate and rhythm, S1 and S2 normal, no murmur, rub   or gallop     Abdomen:     Soft, non-tender, nondistended, normoactive bowel sounds,    no masses, no hepatosplenomegaly  Genitalia:    Normal male external genitalia without lesions.  Testicles without masses.  No inguinal hernias.  Rectal:   Deferred due to age <40 and lack of symptoms  Extremities:   No clubbing, cyanosis or edema.slight tenderness palpation over the right SI joint.  Pearlean Brownie testing was positive.  Normal motion of the hip.  Pulses:   2+ and symmetric all extremities  Skin:   Skin color, texture, turgor normal, no rashes or lesions  Lymph nodes:   Cervical, supraclavicular, and axillary nodes normal  Neurologic:   CNII-XII intact, normal strength, sensation and gait; reflexes 2+ and symmetric throughout          Psych:   Normal mood, affect, hygiene and grooming.          Assessment & Plan:  Routine general medical examination at a health care  facility  Screening examination for venereal disease - Plan: RPR, GC/Chlamydia Probe Amp, GC/Chlamydia Probe Amp  HIV disease (HCC)  Encounter for long-term (current) use of medications  Family history of Lynch syndrome  Polyp of colon, unspecified part of colon, unspecified type  Fear of flying - Plan: ALPRAZolam (XANAX) 0.25 MG tablet  Need for vaccination against Streptococcus pneumoniae - Plan: Pneumococcal conjugate vaccine 13-valent GC chlamydia culture taken from urine and rectal area. He will follow-up with ID when he comes back from his vacation. I will give him Xanax but cautioned him on using Xanax and alcohol. Discussed the sacroiliitis and demonstrated stretching techniques to help with that.

## 2017-11-08 LAB — GC/CHLAMYDIA PROBE AMP
Chlamydia trachomatis, NAA: NEGATIVE
Neisseria gonorrhoeae by PCR: NEGATIVE

## 2017-11-08 LAB — RPR: RPR Ser Ql: NONREACTIVE

## 2017-11-09 LAB — GC/CHLAMYDIA PROBE AMP
Chlamydia trachomatis, NAA: NEGATIVE
Neisseria gonorrhoeae by PCR: NEGATIVE

## 2017-11-23 ENCOUNTER — Other Ambulatory Visit: Payer: BLUE CROSS/BLUE SHIELD

## 2017-12-07 ENCOUNTER — Ambulatory Visit: Payer: BLUE CROSS/BLUE SHIELD | Admitting: Internal Medicine

## 2018-01-09 ENCOUNTER — Other Ambulatory Visit: Payer: BLUE CROSS/BLUE SHIELD

## 2018-01-09 DIAGNOSIS — B20 Human immunodeficiency virus [HIV] disease: Secondary | ICD-10-CM

## 2018-01-10 LAB — T-HELPER CELL (CD4) - (RCID CLINIC ONLY)
CD4 % Helper T Cell: 40 % (ref 33–55)
CD4 T Cell Abs: 840 /uL (ref 400–2700)

## 2018-01-11 LAB — HIV-1 RNA QUANT-NO REFLEX-BLD
HIV 1 RNA Quant: <20 copies/mL
HIV-1 RNA Quant, Log: <1.3 Log copies/mL

## 2018-02-12 ENCOUNTER — Encounter: Payer: Self-pay | Admitting: Internal Medicine

## 2018-02-12 ENCOUNTER — Other Ambulatory Visit: Payer: Self-pay | Admitting: Internal Medicine

## 2018-02-12 ENCOUNTER — Ambulatory Visit (INDEPENDENT_AMBULATORY_CARE_PROVIDER_SITE_OTHER): Payer: Self-pay | Admitting: Internal Medicine

## 2018-02-12 VITALS — BP 122/90 | HR 66 | Temp 97.9°F | Ht 75.0 in | Wt 157.8 lb

## 2018-02-12 DIAGNOSIS — Z23 Encounter for immunization: Secondary | ICD-10-CM

## 2018-02-12 DIAGNOSIS — Z113 Encounter for screening for infections with a predominantly sexual mode of transmission: Secondary | ICD-10-CM

## 2018-02-12 DIAGNOSIS — Z7185 Encounter for immunization safety counseling: Secondary | ICD-10-CM

## 2018-02-12 DIAGNOSIS — B20 Human immunodeficiency virus [HIV] disease: Secondary | ICD-10-CM

## 2018-02-12 DIAGNOSIS — Z7189 Other specified counseling: Secondary | ICD-10-CM

## 2018-02-12 NOTE — Progress Notes (Signed)
HPV 1 of 3 vaccine administered, patient tolerated well.  Patient to  scheduled nurse visit for HPV 2 of 3 in 4 weeks and HPV 3 of 3 in 6 months S.Kamaree Wheatley

## 2018-02-12 NOTE — Assessment & Plan Note (Signed)
I discussed the HPV vaccine and benefits and indication up to 34 yo and will start series today.  Will give #2 in 4 weeks and he can get #3 in 6 months around the end of February.

## 2018-02-12 NOTE — Assessment & Plan Note (Signed)
Doing well with current medication.  He will continue with care her now and once his insurance is valid will transfer his care to Dr. Susann GivensLalonde.

## 2018-02-12 NOTE — Progress Notes (Signed)
   Subjective:    Patient ID: James Jensen, male    DOB: 01-24-1986, 32 y.o.   MRN: [id number]  HPI Here for follow up of HIV Taking Genvoya and denies any missed doses. No associated n/v/d.  Recent negative STI screening.  Asking for screening again with recent sexual activity. Uses condoms.  No other concerns.  Asking about just seeing Dr. Susann GivensLalonde for care.  Having insurance issues now and on HMAP/Ryan White but anticipates having insurance again in January.    Review of Systems  Constitutional: Negative for fatigue.  Gastrointestinal: Negative for diarrhea.  Skin: Negative for rash.       Objective:   Physical Exam  Constitutional: He appears well-developed and well-nourished. No distress.  Eyes: No scleral icterus.  Cardiovascular: Normal rate, regular rhythm and normal heart sounds.  No murmur heard. Pulmonary/Chest: Effort normal and breath sounds normal. No respiratory distress.  Lymphadenopathy:    He has no cervical adenopathy.  Skin: No rash noted.   SH: occasional tobacco       Assessment & Plan:

## 2018-02-12 NOTE — Assessment & Plan Note (Signed)
Recent exposures.  Will check today

## 2018-02-13 ENCOUNTER — Encounter: Payer: Self-pay | Admitting: Internal Medicine

## 2018-02-13 LAB — CYTOLOGY, (ORAL, ANAL, URETHRAL) ANCILLARY ONLY
Chlamydia: NEGATIVE
Chlamydia: NEGATIVE
Neisseria Gonorrhea: NEGATIVE
Neisseria Gonorrhea: NEGATIVE

## 2018-02-13 LAB — URINE CYTOLOGY ANCILLARY ONLY
Chlamydia: NEGATIVE
Neisseria Gonorrhea: NEGATIVE

## 2018-02-13 LAB — RPR: RPR Ser Ql: NONREACTIVE

## 2018-02-15 ENCOUNTER — Telehealth: Payer: Self-pay

## 2018-02-15 NOTE — Telephone Encounter (Signed)
Called patient to inform him that his HPV 2/3 was due in 2 months and there was not a scheduled nurse visit for his injection.  Patient agreed but did not have his schedule on him at the time of the phone so he will call the office back with a date/time that works for him. Also informed him that his HPV 3/3 was already scheduled in December and patient agreed with this appointment.  Patient concerned about his recent lab results. Made patient aware of results as he is only going to see his  PCP and will no longer see Dr. Luciana Axeomer once his insurance changes in January. Patient satisfied at the end of call and no other questions/concerns voiced.  Balthazar Dooly S. LPN

## 2018-02-27 ENCOUNTER — Other Ambulatory Visit: Payer: Self-pay | Admitting: Behavioral Health

## 2018-02-27 DIAGNOSIS — B20 Human immunodeficiency virus [HIV] disease: Secondary | ICD-10-CM

## 2018-02-27 MED ORDER — ELVITEG-COBIC-EMTRICIT-TENOFAF 150-150-200-10 MG PO TABS: 1.0000 | ORAL_TABLET | Freq: Every day | ORAL | 5 refills | Status: DC

## 2018-02-27 NOTE — Telephone Encounter (Signed)
Spoke with patient and schedule nurse visit for injection #2 HPV on 04/16/18 at 2pm. Patient verbalized understanding.   Patient expressed concern regarding muscle in the same arm vaccine was administered. Patient also stated he had pulled a muscle in this same arm days later and had to be out of work for a week.  Explained to that this was likely not due to the administration of the vaccine, but LPN would notify Dr. Luciana Axe for advise.   Vora Clover S. LPN

## 2018-02-28 NOTE — Telephone Encounter (Signed)
Agree, not from the vaccine.

## 2018-04-16 ENCOUNTER — Ambulatory Visit: Payer: Self-pay

## 2018-06-18 ENCOUNTER — Ambulatory Visit: Payer: Self-pay

## 2018-07-26 ENCOUNTER — Ambulatory Visit: Payer: BLUE CROSS/BLUE SHIELD | Admitting: Family Medicine

## 2018-07-26 ENCOUNTER — Encounter: Payer: Self-pay | Admitting: Family Medicine

## 2018-07-26 VITALS — BP 122/80 | HR 73 | Temp 98.1°F | Ht 73.0 in | Wt 162.4 lb

## 2018-07-26 DIAGNOSIS — K635 Polyp of colon: Secondary | ICD-10-CM

## 2018-07-26 DIAGNOSIS — Z720 Tobacco use: Secondary | ICD-10-CM

## 2018-07-26 DIAGNOSIS — F40243 Fear of flying: Secondary | ICD-10-CM

## 2018-07-26 DIAGNOSIS — Z0000 Encounter for general adult medical examination without abnormal findings: Secondary | ICD-10-CM

## 2018-07-26 DIAGNOSIS — K143 Hypertrophy of tongue papillae: Secondary | ICD-10-CM

## 2018-07-26 DIAGNOSIS — B20 Human immunodeficiency virus [HIV] disease: Secondary | ICD-10-CM | POA: Diagnosis not present

## 2018-07-26 DIAGNOSIS — Z800 Family history of malignant neoplasm of digestive organs: Secondary | ICD-10-CM

## 2018-07-26 DIAGNOSIS — Z79899 Other long term (current) drug therapy: Secondary | ICD-10-CM | POA: Diagnosis not present

## 2018-07-26 DIAGNOSIS — Z7185 Encounter for immunization safety counseling: Secondary | ICD-10-CM

## 2018-07-26 DIAGNOSIS — Z113 Encounter for screening for infections with a predominantly sexual mode of transmission: Secondary | ICD-10-CM

## 2018-07-26 DIAGNOSIS — Z7189 Other specified counseling: Secondary | ICD-10-CM

## 2018-07-26 LAB — POCT URINALYSIS DIP (PROADVANTAGE DEVICE)
Bilirubin, UA: NEGATIVE
Blood, UA: NEGATIVE
Glucose, UA: NEGATIVE mg/dL
Ketones, POC UA: NEGATIVE mg/dL
Leukocytes, UA: NEGATIVE
Nitrite, UA: NEGATIVE
Protein Ur, POC: NEGATIVE mg/dL
Specific Gravity, Urine: 1.025
Urobilinogen, Ur: 3.5
pH, UA: 6 (ref 5.0–8.0)

## 2018-07-26 MED ORDER — ALPRAZOLAM 0.25 MG PO TABS: 0.2500 mg | ORAL_TABLET | Freq: Two times a day (BID) | ORAL | 0 refills | Status: DC | PRN

## 2018-07-26 NOTE — Progress Notes (Signed)
Subjective:    Patient ID: James Jensen, male    DOB: 01-May-1986, 33 y.o.   MRN: [id number]  HPI He is here for a complete examination.  He plans to switch his HIV care over to me.  He has been very stable on his HIV medications.  He is sexually active and was active last night but his partner did not wear condoms.  He also has lesions on his neck that he would like evaluated and has noted a brown hairy-like tongue.  He has a family history of colon cancer and is already scheduled for 5-year repeat colonoscopies.  His last one was 2014 so he will follow-up with his gastroenterologist.  He does continue to smoke but has cut down but not completely stopped.  He would like a refill on his Xanax but he uses this very sparingly.  Review of his record also indicates there is a family history of Lynch syndrome.  He does have a fear of flying as well.   Review of Systems  All other systems reviewed and are negative.      Objective:   Physical Exam BP 122/80 (BP Location: Left Arm, Patient Position: Sitting)   Pulse 73   Temp 98.1 F (36.7 C)   Ht 6\' 1"  (1.854 m)   Wt 162 lb 6.4 oz (73.7 kg)   SpO2 98%   BMI 21.43 kg/m   General Appearance:    Alert, cooperative, no distress, appears stated age  Head:    Normocephalic, without obvious abnormality, atraumatic  Eyes:    PERRL, conjunctiva/corneas clear, EOM's intact, fundi    benign  Ears:    Normal TM's and external ear canals  Nose:   Nares normal, mucosa normal, no drainage or sinus   tenderness  Throat:   Lips, mucosa, and tongue normal; teeth and gums normal  Neck:   Supple, no lymphadenopathy;  thyroid:  no   enlargement/tenderness/nodules; no carotid   bruit or JVD     Lungs:     Clear to auscultation bilaterally without wheezes, rales or     ronchi; respirations unlabored      Heart:    Regular rate and rhythm, S1 and S2 normal, no murmur, rub   or gallop     Abdomen:     Soft, non-tender, nondistended, normoactive bowel  sounds,    no masses, no hepatosplenomegaly  Genitalia:    Normal male external genitalia without lesions.  Testicles without masses.  No inguinal hernias.  Rectal:   Deferred due to age <40 and lack of symptoms  Extremities:   No clubbing, cyanosis or edema  Pulses:   2+ and symmetric all extremities  Skin:   Skin color, texture, turgor normal, no rashes two healing lesions are noted on his neck.  Lymph nodes:   Cervical, supraclavicular, and axillary nodes normal  Neurologic:   CNII-XII intact, normal strength, sensation and gait; reflexes 2+ and symmetric throughout          Psych:   Normal mood, affect, hygiene and grooming.          Assessment & Plan:  Routine general medical examination at a health care facility - Plan: CBC with Differential/Platelet, Comprehensive metabolic panel, Lipid panel, T-helper cells (CD4) count (not at [facility]), HIV-1 RNA quant-no reflex-bld, POCT Urinalysis DIP (Proadvantage Device)  Manson Passey hairy tongue  HIV disease (HCC) - Plan: CBC with Differential/Platelet, Comprehensive metabolic panel, Lipid panel, T-helper cells (CD4) count (not at [facility]), HIV-1 RNA quant-no  reflex-bld  Encounter for long-term (current) use of medications - Plan: CBC with Differential/Platelet, Comprehensive metabolic panel, Lipid panel, T-helper cells (CD4) count (not at [facility]), HIV-1 RNA quant-no reflex-bld  Family history of Lynch syndrome  Polyp of colon, unspecified part of colon, unspecified type  Fear of flying - Plan: ALPRAZolam (XANAX) 0.25 MG tablet  Family history of colon cancer  HPV vaccine counseling  Screening examination for venereal disease - Plan: RPR, GC/Chlamydia Probe Amp  Tobacco abuse I explained that the tongue issue is probably brown hairy tongue which is a bacterial overgrowth.  Recommend that he brush his tongue when he brushes his teeth. He will continue on his present HIV medications. Discussed quitting smoking with him entirely. He will schedule  for repeat colonoscopy. Xanax was renewed as he does use this very sparingly. Discussed Gardasil shot with him however he would like to wait on that.

## 2018-07-27 LAB — COMPREHENSIVE METABOLIC PANEL
ALT: 17 IU/L (ref 0–44)
AST: 20 IU/L (ref 0–40)
Albumin/Globulin Ratio: 1.9 (ref 1.2–2.2)
Albumin: 4.8 g/dL (ref 4.0–5.0)
Alkaline Phosphatase: 66 IU/L (ref 39–117)
BUN/Creatinine Ratio: 14 (ref 9–20)
BUN: 14 mg/dL (ref 6–20)
Bilirubin Total: 0.8 mg/dL (ref 0.0–1.2)
CO2: 25 mmol/L (ref 20–29)
Calcium: 9.1 mg/dL (ref 8.7–10.2)
Chloride: 100 mmol/L (ref 96–106)
Creatinine, Ser: 0.98 mg/dL (ref 0.76–1.27)
GFR calc Af Amer: 116 mL/min/{1.73_m2} (ref >59)
GFR calc non Af Amer: 100 mL/min/{1.73_m2} (ref >59)
Globulin, Total: 2.5 g/dL (ref 1.5–4.5)
Glucose: 92 mg/dL (ref 65–99)
Potassium: 4.6 mmol/L (ref 3.5–5.2)
Sodium: 140 mmol/L (ref 134–144)
Total Protein: 7.3 g/dL (ref 6.0–8.5)

## 2018-07-27 LAB — T-HELPER CELLS (CD4) COUNT (NOT AT ARMC)
% CD 4 Pos. Lymph.: 48 % (ref 30.8–58.5)
Absolute CD 4 Helper: 624 /uL (ref 359–1519)
Basophils Absolute: 0 10*3/uL (ref 0.0–0.2)
Basos: 1 %
EOS (ABSOLUTE): 0.1 10*3/uL (ref 0.0–0.4)
Eos: 2 %
Hematocrit: 43.5 % (ref 37.5–51.0)
Hemoglobin: 15.4 g/dL (ref 13.0–17.7)
Immature Grans (Abs): 0 10*3/uL (ref 0.0–0.1)
Immature Granulocytes: 0 %
Lymphocytes Absolute: 1.3 10*3/uL (ref 0.7–3.1)
Lymphs: 31 %
MCH: 32.9 pg (ref 26.6–33.0)
MCHC: 35.4 g/dL (ref 31.5–35.7)
MCV: 93 fL (ref 79–97)
Monocytes Absolute: 0.4 10*3/uL (ref 0.1–0.9)
Monocytes: 10 %
Neutrophils Absolute: 2.4 10*3/uL (ref 1.4–7.0)
Neutrophils: 56 %
Platelets: 204 10*3/uL (ref 150–450)
RBC: 4.68 x10E6/uL (ref 4.14–5.80)
RDW: 12.1 % (ref 11.6–15.4)
WBC: 4.3 10*3/uL (ref 3.4–10.8)

## 2018-07-27 LAB — RPR: RPR Ser Ql: NONREACTIVE

## 2018-07-27 LAB — LIPID PANEL
Chol/HDL Ratio: 1.6 ratio (ref 0.0–5.0)
Cholesterol, Total: 203 mg/dL — ABNORMAL HIGH (ref 100–199)
HDL: 128 mg/dL (ref >39)
LDL Calculated: 68 mg/dL (ref 0–99)
Triglycerides: 37 mg/dL (ref 0–149)
VLDL Cholesterol Cal: 7 mg/dL (ref 5–40)

## 2018-07-27 LAB — HIV-1 RNA QUANT-NO REFLEX-BLD: HIV-1 RNA Viral Load: <20 copies/mL

## 2018-07-31 LAB — GC/CHLAMYDIA PROBE AMP
Chlamydia trachomatis, NAA: NEGATIVE
Neisseria gonorrhoeae by PCR: NEGATIVE

## 2018-08-01 ENCOUNTER — Telehealth: Payer: Self-pay | Admitting: Family Medicine

## 2018-08-01 DIAGNOSIS — B20 Human immunodeficiency virus [HIV] disease: Secondary | ICD-10-CM

## 2018-08-01 MED ORDER — ELVITEG-COBIC-EMTRICIT-TENOFAF 150-150-200-10 MG PO TABS: 1.0000 | ORAL_TABLET | Freq: Every day | ORAL | 3 refills | Status: DC

## 2018-08-01 NOTE — Telephone Encounter (Signed)
Pt called and states that his insurance will pay for a 3 month supply of Genvoya if set to [facility] order pharmacy.pt can be reached at [phone].

## 2018-08-20 ENCOUNTER — Telehealth: Payer: Self-pay | Admitting: Family Medicine

## 2018-08-20 NOTE — Telephone Encounter (Signed)
Pt stopped by and wanted to know if he could get something non habit forming to help him sleep. Xanax is working great he just needs something for sleep.

## 2018-08-20 NOTE — Telephone Encounter (Signed)
Have him set up an appointment so we can discuss this in more detail

## 2018-08-24 NOTE — Telephone Encounter (Signed)
lvm for pt to call and make a med check appt. KH 

## 2018-11-27 ENCOUNTER — Other Ambulatory Visit: Payer: Self-pay

## 2018-11-27 ENCOUNTER — Ambulatory Visit: Payer: BC Managed Care – PPO | Admitting: Family Medicine

## 2018-11-27 VITALS — BP 108/74 | HR 84 | Temp 98.5°F | Wt 158.2 lb

## 2018-11-27 DIAGNOSIS — G479 Sleep disorder, unspecified: Secondary | ICD-10-CM | POA: Diagnosis not present

## 2018-11-27 DIAGNOSIS — S161XXA Strain of muscle, fascia and tendon at neck level, initial encounter: Secondary | ICD-10-CM

## 2018-11-27 MED ORDER — TRAZODONE HCL 50 MG PO TABS: ORAL_TABLET | ORAL | 0 refills | Status: DC
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
## 2018-11-27 NOTE — Patient Instructions (Signed)
Heat for 20 minutes 3 times per day and gentle stretching after thatInsomnia Insomnia is a sleep disorder that makes it difficult to fall asleep or stay asleep. Insomnia can cause fatigue, low energy, difficulty concentrating, mood swings, and poor performance at work or school. There are three different ways to classify insomnia:  Difficulty falling asleep.  Difficulty staying asleep.  Waking up too early in the morning. Any type of insomnia can be long-term (chronic) or short-term (acute). Both are common. Short-term insomnia usually lasts for three months or less. Chronic insomnia occurs at least three times a week for longer than three months. What are the causes? Insomnia may be caused by another condition, situation, or substance, such as:  Anxiety.  Certain medicines.  Gastroesophageal reflux disease (GERD) or other gastrointestinal conditions.  Asthma or other breathing conditions.  Restless legs syndrome, sleep apnea, or other sleep disorders.  Chronic pain.  Menopause.  Stroke.  Abuse of alcohol, tobacco, or illegal drugs.  Mental health conditions, such as depression.  Caffeine.  Neurological disorders, such as Alzheimer's disease.  An overactive thyroid (hyperthyroidism). Sometimes, the cause of insomnia may not be known. What increases the risk? Risk factors for insomnia include:  Gender. Women are affected more often than men.  Age. Insomnia is more common as you get older.  Stress.  Lack of exercise.  Irregular work schedule or working night shifts.  Traveling between different time zones.  Certain medical and mental health conditions. What are the signs or symptoms? If you have insomnia, the main symptom is having trouble falling asleep or having trouble staying asleep. This may lead to other symptoms, such as:  Feeling fatigued or having low energy.  Feeling nervous about going to sleep.  Not feeling rested in the morning.  Having  trouble concentrating.  Feeling irritable, anxious, or depressed. How is this diagnosed? This condition may be diagnosed based on:  Your symptoms and medical history. Your health care provider may ask about: ? Your sleep habits. ? Any medical conditions you have. ? Your mental health.  A physical exam. How is this treated? Treatment for insomnia depends on the cause. Treatment may focus on treating an underlying condition that is causing insomnia. Treatment may also include:  Medicines to help you sleep.  Counseling or therapy.  Lifestyle adjustments to help you sleep better. Follow these instructions at home: Eating and drinking   Limit or avoid alcohol, caffeinated beverages, and cigarettes, especially close to bedtime. These can disrupt your sleep.  Do not eat a large meal or eat spicy foods right before bedtime. This can lead to digestive discomfort that can make it hard for you to sleep. Sleep habits   Keep a sleep diary to help you and your health care provider figure out what could be causing your insomnia. Write down: ? When you sleep. ? When you wake up during the night. ? How well you sleep. ? How rested you feel the next day. ? Any side effects of medicines you are taking. ? What you eat and drink.  Make your bedroom a dark, comfortable place where it is easy to fall asleep. ? Put up shades or blackout curtains to block light from outside. ? Use a white noise machine to block noise. ? Keep the temperature cool.  Limit screen use before bedtime. This includes: ? Watching TV. ? Using your smartphone, tablet, or computer.  Stick to a routine that includes going to bed and waking up at the same times  every day and night. This can help you fall asleep faster. Consider making a quiet activity, such as reading, part of your nighttime routine.  Try to avoid taking naps during the day so that you sleep better at night.  Get out of bed if you are still awake after  15 minutes of trying to sleep. Keep the lights down, but try reading or doing a quiet activity. When you feel sleepy, go back to bed. General instructions  Take over-the-counter and prescription medicines only as told by your health care provider.  Exercise regularly, as told by your health care provider. Avoid exercise starting several hours before bedtime.  Use relaxation techniques to manage stress. Ask your health care provider to suggest some techniques that may work well for you. These may include: ? Breathing exercises. ? Routines to release muscle tension. ? Visualizing peaceful scenes.  Make sure that you drive carefully. Avoid driving if you feel very sleepy.  Keep all follow-up visits as told by your health care provider. This is important. Contact a health care provider if:  You are tired throughout the day.  You have trouble in your daily routine due to sleepiness.  You continue to have sleep problems, or your sleep problems get worse. Get help right away if:  You have serious thoughts about hurting yourself or someone else. If you ever feel like you may hurt yourself or others, or have thoughts about taking your own life, get help right away. You can go to your nearest emergency department or call:  Your local emergency services (911 in the [place]  A suicide crisis helpline, such as the National Suicide Prevention Lifeline at 210-261-80031-(862) 595-0822. This is open 24 hours a day. Summary  Insomnia is a sleep disorder that makes it difficult to fall asleep or stay asleep.  Insomnia can be long-term (chronic) or short-term (acute).  Treatment for insomnia depends on the cause. Treatment may focus on treating an underlying condition that is causing insomnia.  Keep a sleep diary to help you and your health care provider figure out what could be causing your insomnia. This information is not intended to replace advice given to you by your health care provider. Make sure you  discuss any questions you have with your health care provider. Document Released: 06/03/2000 Document Revised: 03/16/2017 Document Reviewed: 03/16/2017 Elsevier Interactive Patient Education  2019 [employer] Heat for 20 minutes 3 times per day .  Gentle stretching after that and you can take up to 4 ibuprofen 3 times per day for the pain.  If you are still having trouble in 2 weeks come back

## 2018-12-17 ENCOUNTER — Other Ambulatory Visit: Payer: Self-pay

## 2018-12-17 ENCOUNTER — Encounter: Payer: Self-pay | Admitting: Family Medicine

## 2018-12-17 ENCOUNTER — Ambulatory Visit: Payer: BC Managed Care – PPO | Admitting: Family Medicine

## 2018-12-17 VITALS — Wt 158.0 lb

## 2018-12-17 DIAGNOSIS — G479 Sleep disorder, unspecified: Secondary | ICD-10-CM

## 2018-12-17 DIAGNOSIS — S161XXA Strain of muscle, fascia and tendon at neck level, initial encounter: Secondary | ICD-10-CM | POA: Diagnosis not present

## 2018-12-17 MED ORDER — ZOLPIDEM TARTRATE 10 MG PO TABS: 10.0000 mg | ORAL_TABLET | Freq: Every evening | ORAL | 0 refills | Status: DC | PRN

## 2018-12-17 NOTE — Progress Notes (Signed)
   Subjective:    Patient ID: James Jensen, male    DOB: 28-Jul-1985, 33 y.o.   MRN: [id number]  HPI Documentation for virtual telephone encounter.  Documentation for virtual audio and video telecommunications through [facility] encounter: The patient was located at home. The provider was located in the office. The patient did consent to this visit and is aware of possible charges through their insurance for this visit. The other persons participating in this telemedicine service were none. Time spent on call was 5 minutes and in review of previous records >15  minutes total. This virtual service is not related to other E/M service within previous 7 days. Today's visit is to discuss cervical strain as well as sleep disturbance.  He does state he is still having neck pain in spite of heat, stretching.  He describes it is is much is 5 out of 10 in terms of pain.  He has no other symptoms. Continues have difficulty with sleep disturbance stating he has a hard time shutting his mind down.  He has tried some psychological maneuvers to put his mind at rest.  So far has not been successful.  He did try Desyrel at 50 mg and that did not help.   Review of Systems     Objective:   Physical Exam Alert and in no distress otherwise not examined      Assessment & Plan:  Sleep disturbance - Plan: zolpidem (AMBIEN) 10 MG tablet, he is to stop the Deseryl and use Ambien. Cautioned with the use of alcohol  Strain of neck muscle, initial encounter - Plan: Ambulatory referral to Physical Therapy, Recheck here in 6 weeks on both of these

## 2018-12-31 ENCOUNTER — Other Ambulatory Visit: Payer: Self-pay

## 2018-12-31 ENCOUNTER — Ambulatory Visit: Payer: BC Managed Care – PPO | Attending: Family Medicine

## 2018-12-31 DIAGNOSIS — M62838 Other muscle spasm: Secondary | ICD-10-CM

## 2018-12-31 DIAGNOSIS — R293 Abnormal posture: Secondary | ICD-10-CM | POA: Diagnosis present

## 2018-12-31 DIAGNOSIS — M542 Cervicalgia: Secondary | ICD-10-CM | POA: Insufficient documentation

## 2018-12-31 NOTE — Patient Instructions (Signed)
Levator stretch 30 sec x 2-3 4-5x/day,   SNAGS for RT rotation  2-3 reps 4-5x/day,  Ice cube for cold to RT parapinals    2-3x/day for 2-3 days.    Cervical and scapula retraction x 3-5 reps 4-5x/day
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
## 2019-01-07 ENCOUNTER — Encounter: Payer: Self-pay | Admitting: Family Medicine

## 2019-01-08 ENCOUNTER — Ambulatory Visit: Payer: BC Managed Care – PPO | Admitting: Physical Therapy

## 2019-01-11 ENCOUNTER — Ambulatory Visit: Payer: BC Managed Care – PPO | Admitting: Physical Therapy

## 2019-01-11 ENCOUNTER — Encounter: Payer: Self-pay | Admitting: Physical Therapy

## 2019-01-11 ENCOUNTER — Other Ambulatory Visit: Payer: Self-pay

## 2019-01-11 DIAGNOSIS — M542 Cervicalgia: Secondary | ICD-10-CM | POA: Diagnosis not present

## 2019-01-11 DIAGNOSIS — R293 Abnormal posture: Secondary | ICD-10-CM

## 2019-01-11 DIAGNOSIS — M62838 Other muscle spasm: Secondary | ICD-10-CM

## 2019-01-11 NOTE — Therapy (Addendum)
[place], [place], [address] Phone: [phone]   Fax:  [phone]  Physical Therapy Treatment/Discharge  Patient Details  Name: James Jensen MRN: [id number] Date of Birth: [birth year] Referring Provider (PT): Jill Alexanders, MD   Encounter Date: 01/11/2019  PT End of Session - 01/11/19 1107    Visit Number  2    Number of Visits  8    Date for PT Re-Evaluation  01/25/19    Authorization Type  BCBS    PT Start Time  1107   pt arrived late   PT Stop Time  1133    PT Time Calculation (min)  26 min    Activity Tolerance  Patient tolerated treatment well    Behavior During Therapy  [facility] for tasks assessed/performed       Past Medical History:  Diagnosis Date  . Colon polyps   . Depression    [facility]   . Family history of Lynch syndrome    sister has PMS2 mutation  . H/O suicide attempt    as teenager.  "Cutting" Depression r/t being a" closet" gay teen   . HIV infection ([place])     History reviewed. No pertinent surgical history.  There were no vitals filed for this visit.  Subjective Assessment - 01/11/19 1109    Subjective  Has been doing stretches at home and yoga.    Currently in Pain?  Yes    Pain Score  4     Pain Location  Neck    Pain Orientation  Right    Pain Descriptors / Indicators  Sore                       [facility] Adult PT Treatment/Exercise - 01/11/19 0001      Exercises   Exercises  Shoulder      Shoulder Exercises: Seated   External Rotation  20 reps    Theraband Level (Shoulder External Rotation)  Level 2 (Red)      Shoulder Exercises: Stretch   Other Shoulder Stretches  door chest stretch      Manual Therapy   Manual Therapy  Joint mobilization;Soft tissue mobilization    Manual therapy comments  skilled palpation and monitoring during TPDN    Joint Mobilization  lateral cervical glides    Soft tissue mobilization  bilateral  upper traps, cervical paraspinals, suboccipitals       Trigger Point Dry Needling - 01/11/19 0001    Consent Given?  Yes    Education Handout Provided  --   verbal education   Muscles Treated Head and Neck  Upper trapezius;Cervical multifidi    Upper Trapezius Response  Twitch reponse elicited;Palpable increased muscle length    Cervical multifidi Response  Twitch reponse elicited;Palpable increased muscle length           PT Education - 01/11/19 1134    Education Details  TPDN & expected outcomes, importance of posture    Person(s) Educated  Patient    Methods  Explanation;Demonstration    Comprehension  Verbalized understanding;Need further instruction          PT Long Term Goals - 12/31/18 1752      PT LONG TERM GOAL #1   Title  He will be independent with all hEP issued    Time  4    Period  Weeks    Status  New      PT LONG  TERM GOAL #2   Title  His AROM with be equal RT to LT    Time  4    Status  New      PT LONG TERM GOAL #3   Title  His pain will be intermittant and decreased 75% or more    Time  4    Period  Weeks    Status  New            Plan - 01/11/19 1146    Clinical Impression Statement  Signficiant tightness in upper trap on Rt side reduced with TPDN. Poor postural alignment at rest with bilateral winging scapula. Encouraged him to stretch frequently throughout the day. Will continue to benefit from strengthening to improve postural alignment.    PT Treatment/Interventions  Dry needling;Passive range of motion;Taping;Manual techniques;Patient/family education;Therapeutic exercise;Traction;Moist Heat;Iontophoresis 39m/ml Dexamethasone;Cryotherapy;Electrical Stimulation;Ultrasound    PT Next Visit Plan  prone strengthening, standing band work, DN PRN    PT Home Exercise Plan  Cervical and scapul retraction for posture. , levator stretch, SNAGS for RT rotation. GHJ ER red tband    Consulted and Agree with Plan of Care  Patient        Patient will benefit from skilled therapeutic intervention in order to improve the following deficits and impairments:  Pain, Postural dysfunction, Increased muscle spasms, Decreased range of motion, Decreased activity tolerance  Visit Diagnosis: 1. Cervicalgia   2. Other muscle spasm   3. Abnormal posture        Problem List Patient Active Problem List   Diagnosis Date Noted  . HPV vaccine counseling 02/12/2018  . Fear of flying 11/07/2017  . Tobacco abuse 06/08/2017  . Family history of colon cancer 07/10/2015  . Screening examination for venereal disease 10/14/2013  . Encounter for long-term (current) use of medications 10/14/2013  . Colon polyps   . Family history of Lynch syndrome   . [place] 12/01/2011    Jessica C. Hightower PT, DPT 01/11/19 11:51 AM   [facility]GR[address] [address]Phone: [phone]  Fax:  [phone] Name: James BlantonMRN: [id number] of Birth: [birth year] PHYSICAL THERAPY DISCHARGE SUMMARY  Visits from Start of Care: 2  Current functional level related to goals / functional outcomes: See above   Remaining deficits: See above   Education / Equipment: Anatomy of condition, POC, HEP, exercise form/rationale  Plan: Patient agrees to discharge.  Patient goals were not met. Patient is being discharged due to not returning since the last visit.  ?????     Jessica C. Hightower PT, DPT 02/19/19 1:32 PM

## 2019-03-30 ENCOUNTER — Emergency Department (HOSPITAL_COMMUNITY)
Admission: EM | Admit: 2019-03-30 | Discharge: 2019-03-30 | Disposition: A | Discharge status: Home / Self Care | Payer: BC Managed Care – PPO | Attending: Emergency Medicine | Admitting: Emergency Medicine

## 2019-03-30 ENCOUNTER — Emergency Department (HOSPITAL_COMMUNITY): Payer: BC Managed Care – PPO

## 2019-03-30 ENCOUNTER — Encounter (HOSPITAL_COMMUNITY): Payer: Self-pay | Admitting: Emergency Medicine

## 2019-03-30 ENCOUNTER — Other Ambulatory Visit: Payer: Self-pay

## 2019-03-30 DIAGNOSIS — Z20828 Contact with and (suspected) exposure to other viral communicable diseases: Secondary | ICD-10-CM | POA: Insufficient documentation

## 2019-03-30 DIAGNOSIS — Z21 Asymptomatic human immunodeficiency virus [HIV] infection status: Secondary | ICD-10-CM | POA: Diagnosis not present

## 2019-03-30 DIAGNOSIS — Z79899 Other long term (current) drug therapy: Secondary | ICD-10-CM | POA: Insufficient documentation

## 2019-03-30 DIAGNOSIS — R0602 Shortness of breath: Secondary | ICD-10-CM | POA: Diagnosis present

## 2019-03-30 DIAGNOSIS — F17210 Nicotine dependence, cigarettes, uncomplicated: Secondary | ICD-10-CM | POA: Diagnosis not present

## 2019-03-30 DIAGNOSIS — R0789 Other chest pain: Secondary | ICD-10-CM | POA: Diagnosis not present

## 2019-03-30 DIAGNOSIS — B349 Viral infection, unspecified: Secondary | ICD-10-CM

## 2019-03-30 LAB — CBC WITH DIFFERENTIAL/PLATELET
Abs Immature Granulocytes: 0.03 10*3/uL (ref 0.00–0.07)
Basophils Absolute: 0 10*3/uL (ref 0.0–0.1)
Basophils Relative: 0 %
Eosinophils Absolute: 0.1 10*3/uL (ref 0.0–0.5)
Eosinophils Relative: 1 %
HCT: 44.3 % (ref 39.0–52.0)
Hemoglobin: 15.2 g/dL (ref 13.0–17.0)
Immature Granulocytes: 0 %
Lymphocytes Relative: 11 %
Lymphs Abs: 1.1 10*3/uL (ref 0.7–4.0)
MCH: 32.2 pg (ref 26.0–34.0)
MCHC: 34.3 g/dL (ref 30.0–36.0)
MCV: 93.9 fL (ref 80.0–100.0)
Monocytes Absolute: 0.9 10*3/uL (ref 0.1–1.0)
Monocytes Relative: 8 %
Neutro Abs: 8.5 10*3/uL — ABNORMAL HIGH (ref 1.7–7.7)
Neutrophils Relative %: 80 %
Platelets: 214 10*3/uL (ref 150–400)
RBC: 4.72 MIL/uL (ref 4.22–5.81)
RDW: 11.8 % (ref 11.5–15.5)
WBC: 10.6 10*3/uL — ABNORMAL HIGH (ref 4.0–10.5)
nRBC: 0 % (ref 0.0–0.2)

## 2019-03-30 LAB — D-DIMER, QUANTITATIVE: D-Dimer, Quant: 0.35 ug/mL-FEU (ref 0.00–0.50)

## 2019-03-30 LAB — COMPREHENSIVE METABOLIC PANEL
ALT: 29 U/L (ref 0–44)
AST: 25 U/L (ref 15–41)
Albumin: 4.5 g/dL (ref 3.5–5.0)
Alkaline Phosphatase: 61 U/L (ref 38–126)
Anion gap: 13 (ref 5–15)
BUN: 10 mg/dL (ref 6–20)
CO2: 22 mmol/L (ref 22–32)
Calcium: 9.1 mg/dL (ref 8.9–10.3)
Chloride: 100 mmol/L (ref 98–111)
Creatinine, Ser: 1.05 mg/dL (ref 0.61–1.24)
GFR calc Af Amer: >60 mL/min (ref >60)
GFR calc non Af Amer: >60 mL/min (ref >60)
Glucose, Bld: 113 mg/dL — ABNORMAL HIGH (ref 70–99)
Potassium: 3.6 mmol/L (ref 3.5–5.1)
Sodium: 135 mmol/L (ref 135–145)
Total Bilirubin: 1 mg/dL (ref 0.3–1.2)
Total Protein: 7.7 g/dL (ref 6.5–8.1)

## 2019-03-30 LAB — TROPONIN I (HIGH SENSITIVITY)
Troponin I (High Sensitivity): 3 ng/L (ref <18)
Troponin I (High Sensitivity): 2 ng/L (ref <18)

## 2019-03-30 MED ORDER — KETOROLAC TROMETHAMINE 15 MG/ML IJ SOLN: 15.0000 mg | Freq: Once | INTRAMUSCULAR | Status: DC

## 2019-03-30 MED ORDER — IBUPROFEN 400 MG PO TABS
600.0000 mg | ORAL_TABLET | Freq: Once | ORAL | Status: AC
  Administered 2019-03-30: 600 mg via ORAL
  Filled 2019-03-30: qty 1

## 2019-03-30 NOTE — ED Provider Notes (Signed)
[facility] EMERGENCY DEPARTMENT Provider Note   CSN: [id number] Arrival date & time: 03/30/19  3154     History   Chief Complaint Chief Complaint  Patient presents with  . Shortness of Breath    HPI James Jensen is a 33 y.o. male presenting for evaluation of shortness of breath and chest pain.  Patient states he started develop a sore throat yesterday.  Today he developed a fever of 100, shortness of breath, right-sided chest pain.  He took ibuprofen which improved his pain, but not completely resolved.  Pain is worse when he takes a deep breath in. He is concerned he has COIVD. He denies current ST, ear pain, cough, n/v, abd pain, urinary sxs, abnormal BMs. He denies recent travel, immobilization, h/o CA, h/o DVT, or hormone use. He denies sick contacts. He denies contact with known coivd-19 positive person. He has a h/o HIV, takes genvoya. No missed doses. Viral loads undetectable.      HPI  Past Medical History:  Diagnosis Date  . Colon polyps   . Depression    [facility]   . Family history of Lynch syndrome    sister has PMS2 mutation  . H/O suicide attempt    as teenager.  "Cutting" Depression r/t being a" closet" gay teen   . HIV infection [facility])     Patient Active Problem List   Diagnosis Date Noted  . HPV vaccine counseling 02/12/2018  . Fear of flying 11/07/2017  . Tobacco abuse 06/08/2017  . Family history of colon cancer 07/10/2015  . Screening examination for venereal disease 10/14/2013  . Encounter for long-term (current) use of medications 10/14/2013  . Colon polyps   . Family history of Lynch syndrome   . HIV disease ([place]) 12/01/2011    History reviewed. No pertinent surgical history.      Home Medications    Prior to Admission medications   Medication Sig Start Date End Date Taking? Authorizing Provider  elvitegravir-cobicistat-emtricitabine-tenofovir (GENVOYA) 150-150-200-10 MG TABS tablet Take 1 tablet  by mouth daily with breakfast. 08/01/18  Yes Denita Lung, MD  ibuprofen (ADVIL) 200 MG tablet Take 600 mg by mouth every 6 (six) hours as needed for mild pain or moderate pain.   Yes [provider]  zolpidem (AMBIEN) 10 MG tablet Take 1 tablet (10 mg total) by mouth at bedtime as needed for up to 30 days for sleep. 12/17/18 03/30/19 Yes Denita Lung, MD  ALPRAZolam Duanne Moron) 0.25 MG tablet Take 1 tablet (0.25 mg total) by mouth 2 (two) times daily as needed for anxiety. Patient not taking: Reported on 12/17/2018 07/26/18   Denita Lung, MD  traZODone (DESYREL) 50 MG tablet 1/2 pill as needed for sleep Patient not taking: Reported on 12/31/2018 11/27/18   Denita Lung, MD    Family History Family History  Problem Relation Age of Onset  . Colon cancer Mother        Cancer gene; onset around age 6  . Brain cancer Brother 10       located at the back of his skull  . Breast cancer Paternal Grandmother 28       dbl mastectomy  . Lung cancer Maternal Grandfather        dx in his 27s and then again in his 1s  . Colon polyps Sister        Genetic testing: PMS2 sequencing revealed deleterious mutation, but EPCAM, MLH1, MSH2 and MSH6 sequencing all  normal  . COPD Paternal Grandfather     Social History Social History   Tobacco Use  . Smoking status: Current Some Day Smoker    Packs/day: 0.10    Years: 14.00    Pack years: 1.40    Types: Cigarettes  . Smokeless tobacco: Never Used  Substance Use Topics  . Alcohol use: Yes    Alcohol/week: 2.0 standard drinks    Types: 2 Standard drinks or equivalent per week    Comment: Drink3-4 times/week  . Drug use: Yes    Frequency: 3.0 times per week    Types: Marijuana    Comment: sometimes     Allergies   Other   Review of Systems Review of Systems  Constitutional: Positive for fever (100).  HENT: Positive for sore throat (resolved).   Respiratory: Positive for shortness of breath (mild).   Cardiovascular: Positive  for chest pain (with inspiration).  All other systems reviewed and are negative.    Physical Exam Updated Vital Signs BP 123/79   Pulse 75   Temp 98.6 F (37 C) (Oral)   Resp (!) 21   Ht 6' (1.829 m)   Wt 77.1 kg   SpO2 97%   BMI 23.06 kg/m   Physical Exam Vitals signs and nursing note reviewed.  Constitutional:      General: He is not in acute distress.    Appearance: He is well-developed.     Comments: Resting comfortably in the bed in NAD  HENT:     Head: Normocephalic and atraumatic.  Eyes:     Conjunctiva/sclera: Conjunctivae normal.     Pupils: Pupils are equal, round, and reactive to light.  Neck:     Musculoskeletal: Normal range of motion and neck supple.  Cardiovascular:     Rate and Rhythm: Normal rate and regular rhythm.     Pulses: Normal pulses.  Pulmonary:     Effort: Pulmonary effort is normal. No respiratory distress.     Breath sounds: Normal breath sounds. No wheezing.     Comments: Speaking in full sentences. Clear lung sounds in all fields. No signs of respiratory distress.  Abdominal:     General: There is no distension.     Palpations: Abdomen is soft. There is no mass.     Tenderness: There is no abdominal tenderness. There is no guarding or rebound.  Musculoskeletal: Normal range of motion.     Right lower leg: No edema.     Left lower leg: No edema.  Skin:    General: Skin is warm and dry.     Capillary Refill: Capillary refill takes less than 2 seconds.  Neurological:     Mental Status: He is alert and oriented to person, place, and time.      ED Treatments / Results  Labs (all labs ordered are listed, but only abnormal results are displayed) Labs Reviewed  CBC WITH DIFFERENTIAL/PLATELET - Abnormal; Notable for the following components:      Result Value   WBC 10.6 (*)    Neutro Abs 8.5 (*)    All other components within normal limits  COMPREHENSIVE METABOLIC PANEL - Abnormal; Notable for the following components:   Glucose,  Bld 113 (*)    All other components within normal limits  NOVEL CORONAVIRUS, NAA (HOSP ORDER, SEND-OUT TO REF LAB; TAT 18-24 HRS)  D-DIMER, QUANTITATIVE (NOT AT [facility])  TROPONIN I (HIGH SENSITIVITY)  TROPONIN I (HIGH SENSITIVITY)    EKG EKG Interpretation  Date/Time:  Saturday March 30 2019 21:01:35 EDT Ventricular Rate:  85 PR Interval:  114 QRS Duration: 102 QT Interval:  370 QTC Calculation: 440 R Axis:   85 Text Interpretation:  Sinus rhythm Probable left atrial enlargement RSR' in V1 or V2, probably normal variant tachycardia resolved. nonspecific ST changes improved Confirmed by Sherwood Gambler [phone]) on 03/30/2019 9:10:14 PM   Radiology Dg Chest 2 View  Result Date: 03/30/2019 CLINICAL DATA:  Chest pain, fever, requesting COVID-19 testing EXAM: CHEST - 2 VIEW COMPARISON:  Radiograph 03/03/2016 FINDINGS: No consolidation, features of edema, pneumothorax, or effusion. Pulmonary vascularity is normally distributed. The cardiomediastinal contours are unremarkable. No acute osseous or soft tissue abnormality. IMPRESSION: No acute cardiopulmonary abnormality. Electronically Signed   By: Lovena Le M.D.   On: 03/30/2019 19:41    Procedures Procedures (including critical care time)  Medications Ordered in ED Medications  ketorolac (TORADOL) 15 MG/ML injection 15 mg (15 mg Intravenous Not Given 03/30/19 2304)  ibuprofen (ADVIL) tablet 600 mg (600 mg Oral Given 03/30/19 2301)     Initial Impression / Assessment and Plan / ED Course  I have reviewed the triage vital signs and the nursing notes.  Pertinent labs & imaging results that were available during my care of the patient were reviewed by me and considered in my medical decision making (see chart for details).        Pt presenting for evaluation of fever, cp, and sob. physical exam reassuring, he appears nontoxic. No risk factors for PE, however sxs consistent with pleuritic pain. Will order ddimer. Xray viewed and  interpreted by me, no pna, pnx, effusion, cardiomegaly. Labs from triage reassuring. Initial trop negative.   Repeat trop negative. ddimer negative, as such doubt PE. Will order OP covid test. Discussed likely viral sxs and symptomatic tx. discussed return if worsening respiratory sxs. Discussed quarantine. At this time, pt appear safe for d/c. Return precautions given. Pt states he understands and agrees to plan.   Final Clinical Impressions(s) / ED Diagnoses   Final diagnoses:  Atypical chest pain  SOB (shortness of breath)  Viral illness    ED Discharge Orders    None       Franchot Heidelberg, PA-C 03/30/19 2328    Sherwood Gambler, MD 03/31/19 1708

## 2019-03-30 NOTE — ED Triage Notes (Signed)
Pt c/o fever above a 100 at home, increase SOB and CP that started today, pt requesting a COVID test.

## 2019-03-30 NOTE — Discharge Instructions (Addendum)
Continue taking home medications as prescribed. Use Tylenol and ibuprofen as needed for pain. Make sure you are staying well-hydrated water. You are being tested for coronavirus.  Results should return in several days.  If positive, you will receive a phone call.  If negative, you will not.  Either way, you may check online on MyChart. If negative and you have continued symptoms, follow-up with your primary care doctor for further evaluation. Return to the emergency room if you develop increased difficulty breathing, or any new, worsening, or concerning symptoms.

## 2019-04-01 LAB — NOVEL CORONAVIRUS, NAA (HOSP ORDER, SEND-OUT TO REF LAB; TAT 18-24 HRS): SARS-CoV-2, NAA: NOT DETECTED

## 2019-06-07 ENCOUNTER — Telehealth: Payer: Self-pay | Admitting: Family Medicine

## 2019-06-07 DIAGNOSIS — G479 Sleep disorder, unspecified: Secondary | ICD-10-CM

## 2019-06-07 MED ORDER — ZOLPIDEM TARTRATE 10 MG PO TABS: 10.0000 mg | ORAL_TABLET | Freq: Every evening | ORAL | 0 refills | Status: DC | PRN

## 2019-06-07 NOTE — Telephone Encounter (Signed)
I called it in 

## 2019-06-07 NOTE — Telephone Encounter (Signed)
Pt called and wants to see if he could get a refill on his Ambien sent to the [facility] on [address]

## 2019-06-10 ENCOUNTER — Other Ambulatory Visit: Payer: Self-pay

## 2019-06-10 ENCOUNTER — Other Ambulatory Visit (INDEPENDENT_AMBULATORY_CARE_PROVIDER_SITE_OTHER): Payer: BC Managed Care – PPO

## 2019-06-10 DIAGNOSIS — Z23 Encounter for immunization: Secondary | ICD-10-CM | POA: Diagnosis not present

## 2019-08-06 ENCOUNTER — Encounter: Payer: BC Managed Care – PPO | Admitting: Family Medicine

## 2019-08-20 ENCOUNTER — Encounter: Payer: Self-pay | Admitting: Family Medicine

## 2019-08-20 ENCOUNTER — Ambulatory Visit (INDEPENDENT_AMBULATORY_CARE_PROVIDER_SITE_OTHER): Payer: 59 | Admitting: Family Medicine

## 2019-08-20 ENCOUNTER — Other Ambulatory Visit: Payer: Self-pay

## 2019-08-20 VITALS — BP 122/80 | HR 92 | Temp 97.8°F | Ht 73.75 in | Wt 173.2 lb

## 2019-08-20 DIAGNOSIS — B20 Human immunodeficiency virus [HIV] disease: Secondary | ICD-10-CM

## 2019-08-20 DIAGNOSIS — Z209 Contact with and (suspected) exposure to unspecified communicable disease: Secondary | ICD-10-CM

## 2019-08-20 DIAGNOSIS — Z720 Tobacco use: Secondary | ICD-10-CM

## 2019-08-20 DIAGNOSIS — Z0000 Encounter for general adult medical examination without abnormal findings: Secondary | ICD-10-CM

## 2019-08-20 DIAGNOSIS — G479 Sleep disorder, unspecified: Secondary | ICD-10-CM | POA: Diagnosis not present

## 2019-08-20 DIAGNOSIS — Z800 Family history of malignant neoplasm of digestive organs: Secondary | ICD-10-CM

## 2019-08-20 DIAGNOSIS — Z79899 Other long term (current) drug therapy: Secondary | ICD-10-CM | POA: Diagnosis not present

## 2019-08-20 DIAGNOSIS — Z1152 Encounter for screening for COVID-19: Secondary | ICD-10-CM

## 2019-08-20 LAB — POCT URINALYSIS DIP (PROADVANTAGE DEVICE)
Blood, UA: NEGATIVE
Glucose, UA: NEGATIVE mg/dL
Ketones, POC UA: NEGATIVE mg/dL
Leukocytes, UA: NEGATIVE
Nitrite, UA: NEGATIVE
Specific Gravity, Urine: 1.03
Urobilinogen, Ur: 0.2
pH, UA: 6 (ref 5.0–8.0)

## 2019-08-20 MED ORDER — GENVOYA 150-150-200-10 MG PO TABS: 1.0000 | ORAL_TABLET | Freq: Every day | ORAL | 3 refills | Status: DC

## 2019-08-20 MED ORDER — ZOLPIDEM TARTRATE 10 MG PO TABS: 10.0000 mg | ORAL_TABLET | Freq: Every evening | ORAL | 0 refills | Status: DC | PRN

## 2019-08-20 NOTE — Progress Notes (Signed)
   Subjective:    Patient ID: James Jensen, male    DOB: May 30, 1986, 34 y.o.   MRN: [id number]  HPI He is here for complete examination.  He does have underlying HIV disease and is doing quite nicely on his present medication.  He has no concerns about that.  He has had difficulty with sleep disturbance and does use Ambien roughly twice per week.  It allows him to get roughly 6 hours of sleep per night.  He sometimes has hard time unwinding.  In October he did have difficulty with shortness of breath, cough and fever however his Covid test is negative.  He is concerned over a false negative.  Presently he does have a slight sore throat and occasional difficulty with shortness of breath.  He also would like to be STD tested to be safe.  Review of the record indicates he does have a family history of Lynch syndrome.  He does smoke but smokes very little.  He is in the process of getting a degree and does plan to go further with his education.  Family and social history as well as health maintenance and immunizations was reviewed.   Review of Systems  All other systems reviewed and are negative.      Objective:   Physical Exam Alert and in no distress. Tympanic membranes and canals are normal. Pharyngeal area is normal. Neck is supple without adenopathy or thyromegaly. Cardiac exam shows a regular sinus rhythm without murmurs or gallops. Lungs are clear to auscultation.  Abdominal exam shows normal bowel sounds without masses or tenderness.  [name] shows normal circumcised male.  Testes normal.      Assessment & Plan:  Routine general medical examination at a health care facility - Plan: POCT Urinalysis DIP (Proadvantage Device), CBC with Differential, Lipid Panel, Comprehensive metabolic panel  Sleep disturbance - Plan: zolpidem (AMBIEN) 10 MG tablet  Encounter for long-term (current) use of medications  HIV disease (HCC) - Plan: HIV 1 RNA quant-no reflex-bld, T-helper cells (CD4) count  (not at [facility]), elvitegravir-cobicistat-emtricitabine-tenofovir (GENVOYA) 150-150-200-10 MG TABS tablet  Family history of Lynch syndrome  Contact with or exposure to communicable disease - Plan: GC/Chlamydia Probe Amp, RPR  Encounter for screening for COVID-19 - Plan: SAR CoV2 Serology (COVID 19)AB(IGG)IA  Tobacco abuse I discussed the sleep disturbance with him in detail.  He is to continue using Ambien but did strongly encourage him to possibly get counseling to help him learn to deal with stress that seems to be affecting his ability to fall asleep. I will do Covid antibody testing.

## 2019-08-21 LAB — COMPREHENSIVE METABOLIC PANEL
ALT: 30 IU/L (ref 0–44)
AST: 30 IU/L (ref 0–40)
Albumin/Globulin Ratio: 1.6 (ref 1.2–2.2)
Albumin: 4.4 g/dL (ref 4.0–5.0)
Alkaline Phosphatase: 90 IU/L (ref 39–117)
BUN/Creatinine Ratio: 10 (ref 9–20)
BUN: 10 mg/dL (ref 6–20)
Bilirubin Total: 0.3 mg/dL (ref 0.0–1.2)
CO2: 25 mmol/L (ref 20–29)
Calcium: 9.2 mg/dL (ref 8.7–10.2)
Chloride: 101 mmol/L (ref 96–106)
Creatinine, Ser: 0.97 mg/dL (ref 0.76–1.27)
GFR calc Af Amer: 116 mL/min/{1.73_m2} (ref >59)
GFR calc non Af Amer: 101 mL/min/{1.73_m2} (ref >59)
Globulin, Total: 2.7 g/dL (ref 1.5–4.5)
Glucose: 93 mg/dL (ref 65–99)
Potassium: 4.4 mmol/L (ref 3.5–5.2)
Sodium: 138 mmol/L (ref 134–144)
Total Protein: 7.1 g/dL (ref 6.0–8.5)

## 2019-08-21 LAB — T-HELPER CELLS (CD4) COUNT (NOT AT ARMC)
% CD 4 Pos. Lymph.: 49.6 % (ref 30.8–58.5)
Absolute CD 4 Helper: 942 /uL (ref 359–1519)
Basophils Absolute: 0 10*3/uL (ref 0.0–0.2)
Basos: 1 %
EOS (ABSOLUTE): 0.1 10*3/uL (ref 0.0–0.4)
Eos: 2 %
Hematocrit: 40.4 % (ref 37.5–51.0)
Hemoglobin: 14.7 g/dL (ref 13.0–17.7)
Immature Grans (Abs): 0 10*3/uL (ref 0.0–0.1)
Immature Granulocytes: 0 %
Lymphocytes Absolute: 1.9 10*3/uL (ref 0.7–3.1)
Lymphs: 34 %
MCH: 32.7 pg (ref 26.6–33.0)
MCHC: 36.4 g/dL — ABNORMAL HIGH (ref 31.5–35.7)
MCV: 90 fL (ref 79–97)
Monocytes Absolute: 0.6 10*3/uL (ref 0.1–0.9)
Monocytes: 11 %
Neutrophils Absolute: 3 10*3/uL (ref 1.4–7.0)
Neutrophils: 52 %
Platelets: 261 10*3/uL (ref 150–450)
RBC: 4.49 x10E6/uL (ref 4.14–5.80)
RDW: 12.3 % (ref 11.6–15.4)
WBC: 5.7 10*3/uL (ref 3.4–10.8)

## 2019-08-21 LAB — SAR COV2 SEROLOGY (COVID19)AB(IGG),IA: DiaSorin SARS-CoV-2 Ab, IgG: NEGATIVE

## 2019-08-21 LAB — HIV-1 RNA QUANT-NO REFLEX-BLD: HIV-1 RNA Viral Load: <20 copies/mL

## 2019-08-21 LAB — GC/CHLAMYDIA PROBE AMP
Chlamydia trachomatis, NAA: NEGATIVE
Neisseria Gonorrhoeae by PCR: NEGATIVE

## 2019-08-21 LAB — LIPID PANEL
Chol/HDL Ratio: 2.1 ratio (ref 0.0–5.0)
Cholesterol, Total: 193 mg/dL (ref 100–199)
HDL: 94 mg/dL (ref >39)
LDL Chol Calc (NIH): 89 mg/dL (ref 0–99)
Triglycerides: 52 mg/dL (ref 0–149)
VLDL Cholesterol Cal: 10 mg/dL (ref 5–40)

## 2019-08-21 LAB — RPR: RPR Ser Ql: NONREACTIVE

## 2019-08-21 NOTE — Addendum Note (Signed)
Addended by: Ronnald Nian on: 08/21/2019 12:05 PM   Modules accepted: Level of Service

## 2019-08-30 ENCOUNTER — Ambulatory Visit: Payer: Self-pay | Attending: Internal Medicine

## 2019-08-30 DIAGNOSIS — Z23 Encounter for immunization: Secondary | ICD-10-CM

## 2019-08-30 NOTE — Progress Notes (Signed)
   Covid-19 Vaccination Clinic  Name:  James Jensen    MRN: [id number] DOB: [birth year]  08/30/2019  [name] was observed post Covid-19 immunization for 15 minutes without incident. He was provided with Vaccine Information Sheet and instruction to access the V-Safe system.   [name] was instructed to call 911 with any severe reactions post vaccine: Marland Kitchen Difficulty breathing  . Swelling of face and throat  . A fast heartbeat  . A bad rash all over body  . Dizziness and weakness   Immunizations Administered    Name Date Dose VIS Date Route   Pfizer COVID-19 Vaccine 08/30/2019  1:51 PM 0.3 mL 05/31/2019 Intramuscular   Manufacturer: [employer], [employer]   Lot: [id number]   NDC: [address]-3

## 2019-09-23 ENCOUNTER — Ambulatory Visit: Payer: Self-pay | Attending: Internal Medicine

## 2019-09-23 DIAGNOSIS — Z23 Encounter for immunization: Secondary | ICD-10-CM

## 2019-09-23 NOTE — Progress Notes (Signed)
   Covid-19 Vaccination Clinic  Name:  [name]    MRN: [id number] DOB: [birth year]  09/23/2019  [name] was observed post Covid-19 immunization for 15 minutes without incident. He was provided with Vaccine Information Sheet and instruction to access the V-Safe system.   [name] was instructed to call 911 with any severe reactions post vaccine: Marland Kitchen Difficulty breathing  . Swelling of face and throat  . A fast heartbeat  . A bad rash all over body  . Dizziness and weakness   Immunizations Administered    Name Date Dose VIS Date Route   Pfizer COVID-19 Vaccine 09/23/2019  3:39 PM 0.3 mL 05/31/2019 Intramuscular   Manufacturer: [employer], [employer]   Lot: [id number]   NDC: [address]-0

## 2019-11-07 ENCOUNTER — Telehealth: Payer: Self-pay | Admitting: Family Medicine

## 2019-11-07 NOTE — Telephone Encounter (Signed)
Pt came in and dropped off form. If any questions pt can be reached at [phone].

## 2019-11-13 ENCOUNTER — Telehealth: Payer: Self-pay

## 2019-11-13 DIAGNOSIS — G479 Sleep disorder, unspecified: Secondary | ICD-10-CM

## 2019-11-13 MED ORDER — ZOLPIDEM TARTRATE 10 MG PO TABS: 10.0000 mg | ORAL_TABLET | Freq: Every evening | ORAL | 0 refills | Status: DC | PRN

## 2019-11-13 NOTE — Telephone Encounter (Signed)
Pt. Called stating that he needs a refill on his Ambien sent in to the [facility] on [address] pt. Last apt was 08/20/19.

## 2020-01-01 ENCOUNTER — Other Ambulatory Visit: Payer: Self-pay | Admitting: Family Medicine

## 2020-01-01 DIAGNOSIS — G479 Sleep disorder, unspecified: Secondary | ICD-10-CM

## 2020-01-01 MED ORDER — ZOLPIDEM TARTRATE 10 MG PO TABS: 10.0000 mg | ORAL_TABLET | Freq: Every evening | ORAL | 0 refills | Status: DC | PRN

## 2020-01-01 NOTE — Telephone Encounter (Signed)
Pt called and left message to refill ambien.

## 2020-01-01 NOTE — Telephone Encounter (Signed)
Ok to give him Ambien refill of 20 tablets as Dr. Susann Givens has been prescribing.

## 2020-01-01 NOTE — Telephone Encounter (Signed)
Please send in °

## 2020-02-03 ENCOUNTER — Telehealth: Payer: Self-pay | Admitting: Family Medicine

## 2020-02-03 DIAGNOSIS — G479 Sleep disorder, unspecified: Secondary | ICD-10-CM

## 2020-02-03 MED ORDER — ZOLPIDEM TARTRATE 10 MG PO TABS: 10.0000 mg | ORAL_TABLET | Freq: Every evening | ORAL | 0 refills | Status: DC | PRN

## 2020-02-03 NOTE — Telephone Encounter (Signed)
Pt called requesting refill on Ambien  [facility] [address]  Please call pt

## 2020-02-25 ENCOUNTER — Encounter: Payer: 59 | Admitting: Family Medicine

## 2020-03-10 ENCOUNTER — Ambulatory Visit (INDEPENDENT_AMBULATORY_CARE_PROVIDER_SITE_OTHER): Payer: 59 | Admitting: Family Medicine

## 2020-03-10 ENCOUNTER — Encounter: Payer: Self-pay | Admitting: Family Medicine

## 2020-03-10 ENCOUNTER — Other Ambulatory Visit: Payer: Self-pay

## 2020-03-10 VITALS — BP 102/68 | HR 83 | Temp 98.3°F | Wt 168.8 lb

## 2020-03-10 DIAGNOSIS — Z79899 Other long term (current) drug therapy: Secondary | ICD-10-CM | POA: Diagnosis not present

## 2020-03-10 DIAGNOSIS — G479 Sleep disorder, unspecified: Secondary | ICD-10-CM

## 2020-03-10 DIAGNOSIS — Z23 Encounter for immunization: Secondary | ICD-10-CM | POA: Diagnosis not present

## 2020-03-10 DIAGNOSIS — B20 Human immunodeficiency virus [HIV] disease: Secondary | ICD-10-CM

## 2020-03-10 DIAGNOSIS — Z720 Tobacco use: Secondary | ICD-10-CM | POA: Diagnosis not present

## 2020-03-10 MED ORDER — ZOLPIDEM TARTRATE 10 MG PO TABS: 10.0000 mg | ORAL_TABLET | Freq: Every evening | ORAL | 1 refills | Status: AC | PRN

## 2020-03-10 NOTE — Progress Notes (Signed)
   Subjective:    Patient ID: James Jensen, male    DOB: [birth year], 34 y.o.   MRN: [id number]  HPI He is here for a med check appointment. The continues on Genvoya and is doing quite nicely on that. He has been HIV positive for the last 8 years and has been very stable on his medication regimen. He recently moved to [place] [facility] and is now in school. Also there is some difficulty with his grandmother who apparently has Alzheimer's and is slowly deteriorating. This has him quite stressed interfering with his sleep pattern. He does smoke but minimally. Otherwise he seems to be doing fairly well.   Review of Systems     Objective:   Physical Exam Alert and in no distress. Tympanic membranes and canals are normal. Pharyngeal area is normal. Neck is supple without adenopathy or thyromegaly. Cardiac exam shows a regular sinus rhythm without murmurs or gallops. Lungs are clear to auscultation. 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 His immunizations were updated. Again discussed the need for him to avoid all tobacco. The rest of time was spent discussing sleep and sleep hygiene with him. Also discussed the" triage" concept to help him prioritize what is important and what is not important. Also strongly  encouraged him to continue with his meditation to help with all of this. I will renew his Ambien but watch this. Explained the fact that sometimes psychologically you can get hooked on this. He did understand this. He would also like the booster for Covid which I think is appropriate. Flu shot also given.

## 2020-03-11 LAB — COMPREHENSIVE METABOLIC PANEL
ALT: 25 IU/L (ref 0–44)
AST: 23 IU/L (ref 0–40)
Albumin/Globulin Ratio: 1.7 (ref 1.2–2.2)
Albumin: 4.8 g/dL (ref 4.0–5.0)
Alkaline Phosphatase: 76 IU/L (ref 44–121)
BUN/Creatinine Ratio: 8 — ABNORMAL LOW (ref 9–20)
BUN: 8 mg/dL (ref 6–20)
Bilirubin Total: 0.4 mg/dL (ref 0.0–1.2)
CO2: 24 mmol/L (ref 20–29)
Calcium: 9.4 mg/dL (ref 8.7–10.2)
Chloride: 102 mmol/L (ref 96–106)
Creatinine, Ser: 0.95 mg/dL (ref 0.76–1.27)
GFR calc Af Amer: 119 mL/min/{1.73_m2} (ref >59)
GFR calc non Af Amer: 103 mL/min/{1.73_m2} (ref >59)
Globulin, Total: 2.8 g/dL (ref 1.5–4.5)
Glucose: 91 mg/dL (ref 65–99)
Potassium: 4.5 mmol/L (ref 3.5–5.2)
Sodium: 141 mmol/L (ref 134–144)
Total Protein: 7.6 g/dL (ref 6.0–8.5)

## 2020-03-11 LAB — LIPID PANEL
Chol/HDL Ratio: 2.5 ratio (ref 0.0–5.0)
Cholesterol, Total: 207 mg/dL — ABNORMAL HIGH (ref 100–199)
HDL: 84 mg/dL (ref >39)
LDL Chol Calc (NIH): 112 mg/dL — ABNORMAL HIGH (ref 0–99)
Triglycerides: 60 mg/dL (ref 0–149)
VLDL Cholesterol Cal: 11 mg/dL (ref 5–40)

## 2020-03-11 LAB — T-HELPER CELLS (CD4) COUNT (NOT AT ARMC)
% CD 4 Pos. Lymph.: 53.2 % (ref 30.8–58.5)
Absolute CD 4 Helper: 851 /uL (ref 359–1519)
Basophils Absolute: 0 10*3/uL (ref 0.0–0.2)
Basos: 1 %
EOS (ABSOLUTE): 0.1 10*3/uL (ref 0.0–0.4)
Eos: 2 %
Hematocrit: 42.1 % (ref 37.5–51.0)
Hemoglobin: 15 g/dL (ref 13.0–17.7)
Immature Grans (Abs): 0 10*3/uL (ref 0.0–0.1)
Immature Granulocytes: 0 %
Lymphocytes Absolute: 1.6 10*3/uL (ref 0.7–3.1)
Lymphs: 36 %
MCH: 32.1 pg (ref 26.6–33.0)
MCHC: 35.6 g/dL (ref 31.5–35.7)
MCV: 90 fL (ref 79–97)
Monocytes Absolute: 0.4 10*3/uL (ref 0.1–0.9)
Monocytes: 10 %
Neutrophils Absolute: 2.2 10*3/uL (ref 1.4–7.0)
Neutrophils: 51 %
Platelets: 224 10*3/uL (ref 150–450)
RBC: 4.68 x10E6/uL (ref 4.14–5.80)
RDW: 11.8 % (ref 11.6–15.4)
WBC: 4.4 10*3/uL (ref 3.4–10.8)

## 2020-03-11 LAB — HIV-1 RNA QUANT-NO REFLEX-BLD: HIV-1 RNA Viral Load: <20 copies/mL

## 2020-04-15 IMAGING — DX DG CHEST 2V
2 series · 2 of 2 positions shown · non-contrast
Comparison: Radiograph 03/03/2016

CLINICAL DATA: Chest pain, fever, requesting 2OYI4-CP testing

EXAM:
CHEST - 2 VIEW

[chest pa]
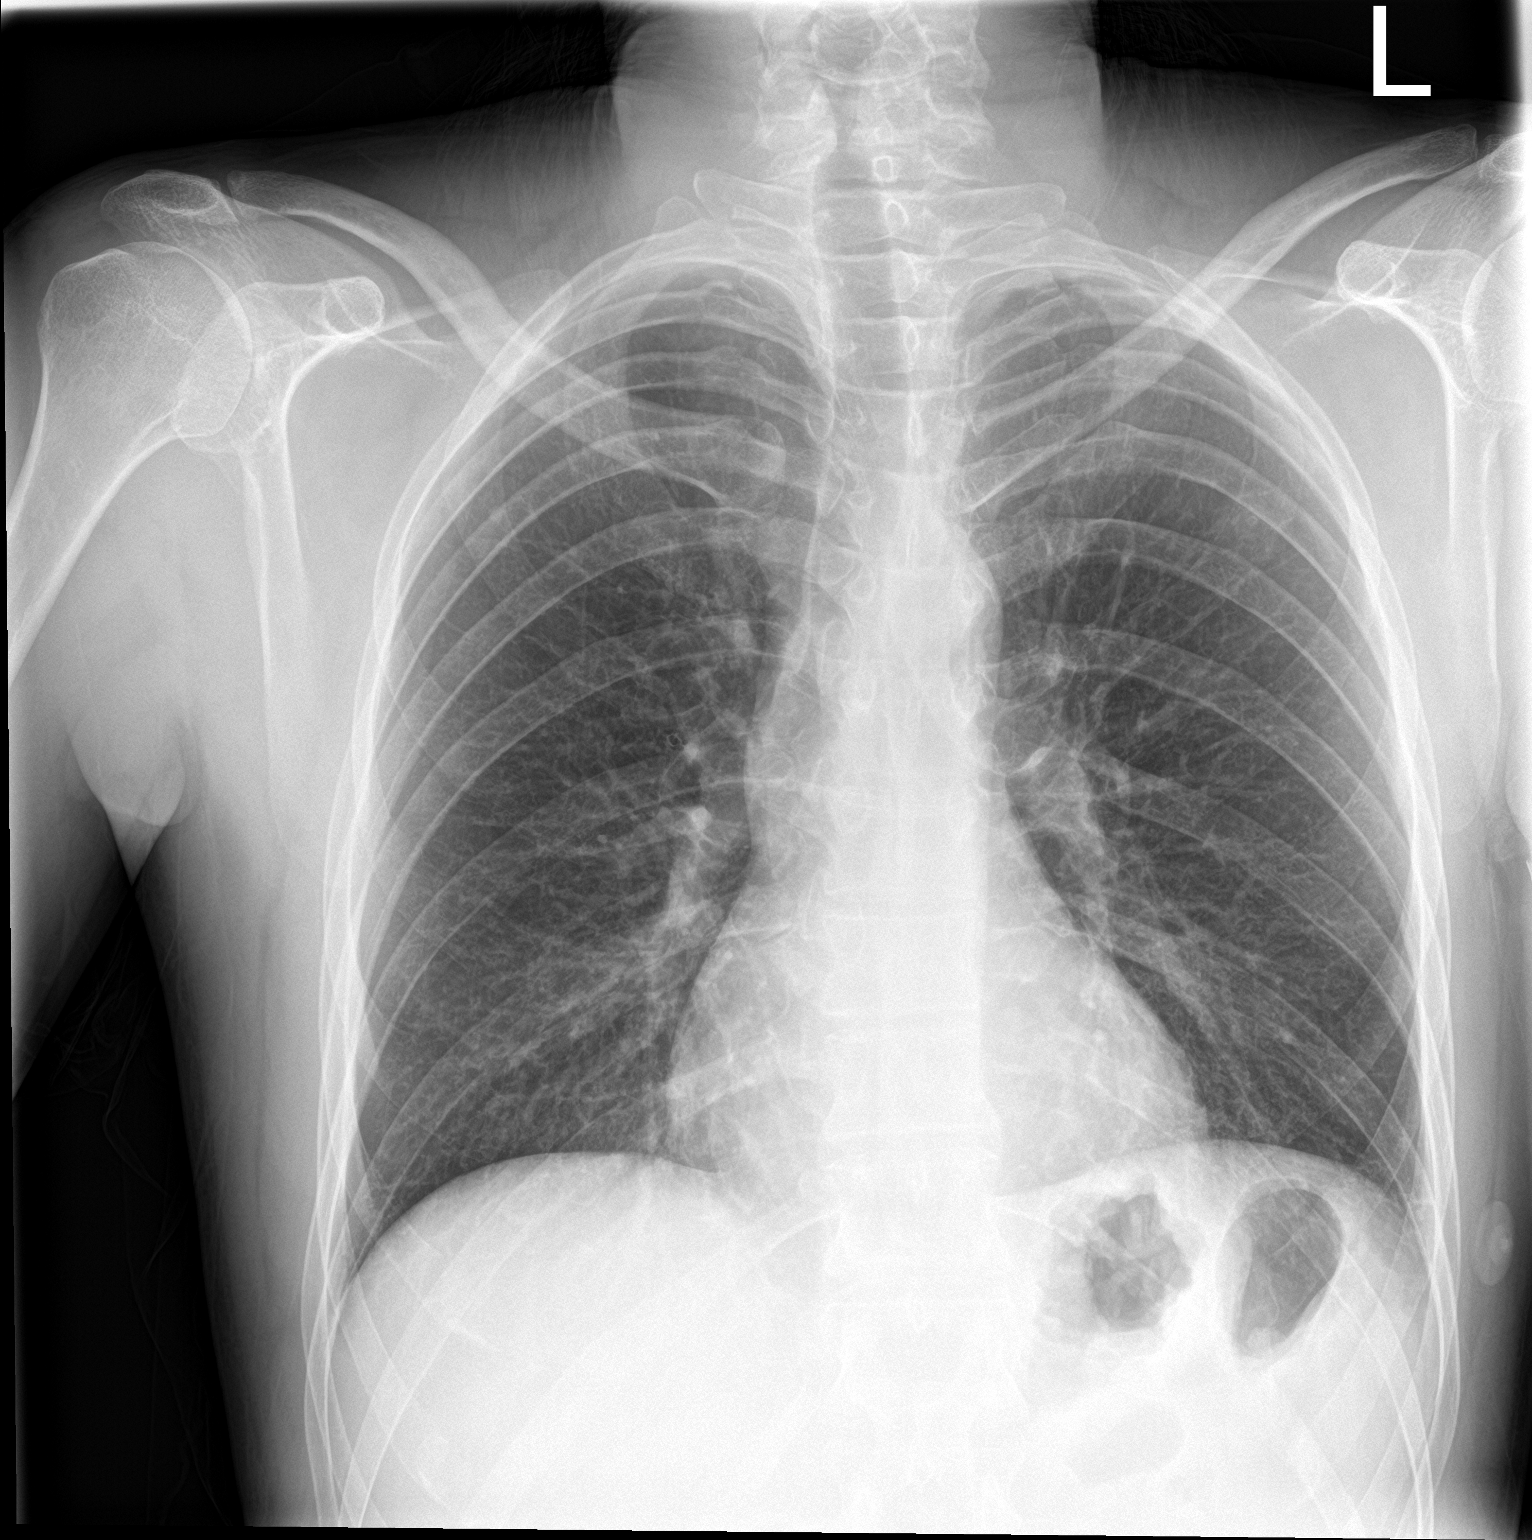

[chest lat]
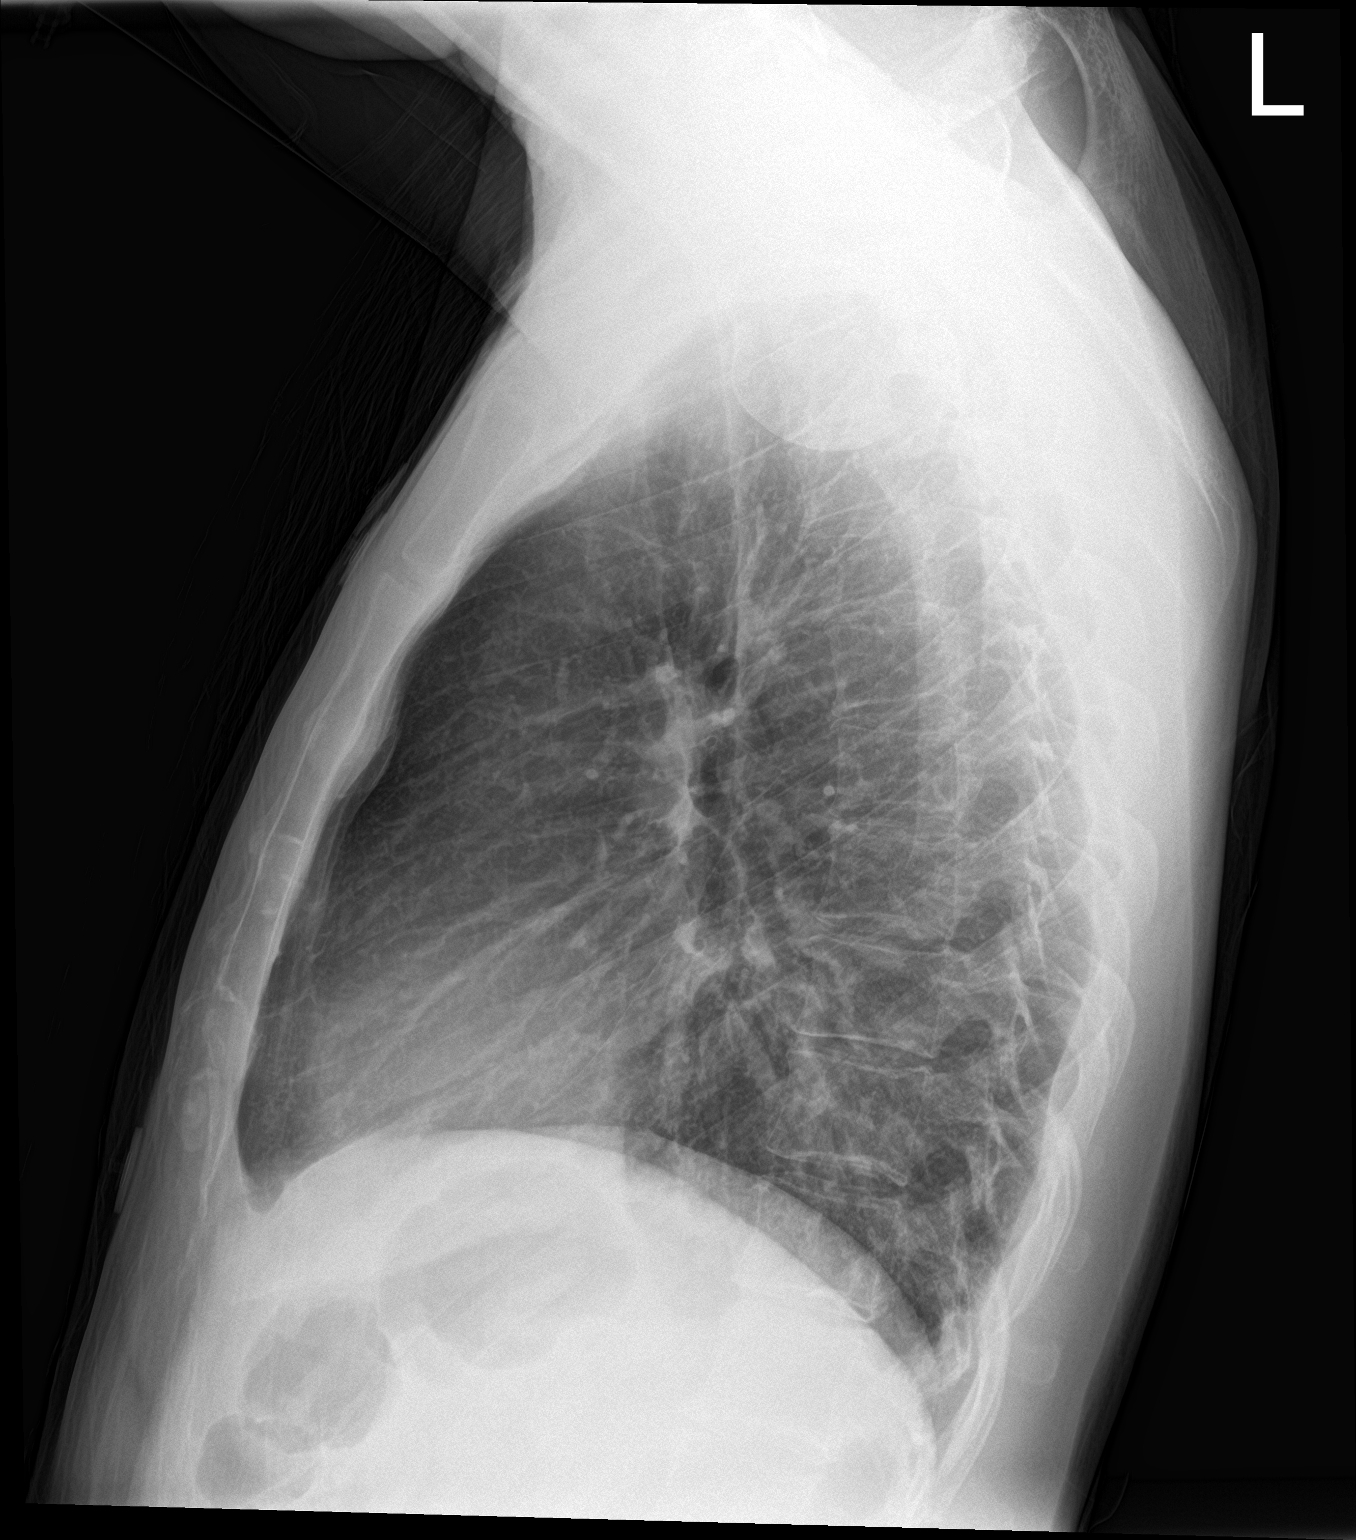

[2 of 2 positions shown; findings below may reference images not displayed]

FINDINGS: No consolidation, features of edema, pneumothorax, or effusion.
Pulmonary vascularity is normally distributed. The cardiomediastinal
contours are unremarkable. No acute osseous or soft tissue
abnormality.
IMPRESSION: No acute cardiopulmonary abnormality.

## 2020-07-03 ENCOUNTER — Telehealth: Payer: Self-pay

## 2020-07-05 NOTE — Telephone Encounter (Signed)
P.A. approved til 06/30/21, sent pt Mychart message

## 2020-08-21 ENCOUNTER — Other Ambulatory Visit: Payer: Self-pay | Admitting: Family Medicine

## 2020-08-21 ENCOUNTER — Encounter: Payer: 59 | Admitting: Family Medicine

## 2020-08-21 DIAGNOSIS — B20 Human immunodeficiency virus [HIV] disease: Secondary | ICD-10-CM

## 2020-08-21 NOTE — Telephone Encounter (Signed)
[facility] is requesting to fill pt genvoya. Please advise [facility]

## 2021-01-11 ENCOUNTER — Telehealth: Payer: Self-pay

## 2021-01-11 NOTE — Telephone Encounter (Signed)
Left message to schedule CPE per [facility] by end of year

## 2021-02-01 ENCOUNTER — Other Ambulatory Visit: Payer: Self-pay | Admitting: Family Medicine

## 2021-02-01 DIAGNOSIS — B20 Human immunodeficiency virus [HIV] disease: Secondary | ICD-10-CM

## 2021-02-01 NOTE — Telephone Encounter (Signed)
[facility] is requesting to fill pt genvoya. Please advise [facility] 

## 2021-06-04 ENCOUNTER — Ambulatory Visit (INDEPENDENT_AMBULATORY_CARE_PROVIDER_SITE_OTHER): Payer: 59 | Admitting: Family Medicine

## 2021-06-04 ENCOUNTER — Other Ambulatory Visit: Payer: Self-pay

## 2021-06-04 ENCOUNTER — Encounter: Payer: Self-pay | Admitting: Family Medicine

## 2021-06-04 VITALS — BP 130/84 | HR 67 | Temp 98.5°F | Ht 74.0 in | Wt 170.2 lb

## 2021-06-04 DIAGNOSIS — Z800 Family history of malignant neoplasm of digestive organs: Secondary | ICD-10-CM

## 2021-06-04 DIAGNOSIS — Z209 Contact with and (suspected) exposure to unspecified communicable disease: Secondary | ICD-10-CM

## 2021-06-04 DIAGNOSIS — B20 Human immunodeficiency virus [HIV] disease: Secondary | ICD-10-CM

## 2021-06-04 DIAGNOSIS — Z0000 Encounter for general adult medical examination without abnormal findings: Secondary | ICD-10-CM | POA: Diagnosis not present

## 2021-06-04 DIAGNOSIS — K635 Polyp of colon: Secondary | ICD-10-CM

## 2021-06-04 DIAGNOSIS — Z79899 Other long term (current) drug therapy: Secondary | ICD-10-CM | POA: Diagnosis not present

## 2021-06-04 MED ORDER — BIKTARVY 50-200-25 MG PO TABS: 1.0000 | ORAL_TABLET | Freq: Every day | ORAL | 3 refills | Status: AC

## 2021-06-04 MED ORDER — DOXYCYCLINE HYCLATE 100 MG PO TABS: 100.0000 mg | ORAL_TABLET | Freq: Two times a day (BID) | ORAL | 0 refills | Status: AC

## 2021-06-04 NOTE — Progress Notes (Signed)
° °  Subjective:    Patient ID: James Jensen, male    DOB: 02-09-1986, 35 y.o.   MRN: [id number]  HPI He is here for complete examination.  He continues on Genvoya and is having no difficulty with that.  He would like to be tested for STDs as he does have exposure.  He also has a history of colonic polyps with a family history of Lynch syndrome.  His last colonoscopy was in 2017.  He has also cut back on his alcohol consumption and now does not smoke as well.  He is working on getting his degree and will be furthering his education and [place].  Otherwise his family and social history as well as health maintenance and immunizations was reviewed.  He will postpone getting COVID and the flu until such time as he can take a couple days off in case he has some adverse reaction from it.   Review of Systems  All other systems reviewed and are negative.     Objective:   Physical Exam Alert and in no distress. Tympanic membranes and canals are normal. Pharyngeal area is normal. Neck is supple without adenopathy or thyromegaly. Cardiac exam shows a regular sinus rhythm without murmurs or gallops. Lungs are clear to auscultation.  Abdominal exam shows no masses or tenderness.  [name] shows normal circumcised male.  Testes normal.        Assessment & Plan:  Routine general medical examination at a health care facility - Plan: CBC with Differential/Platelet, Comprehensive metabolic panel, Lipid panel  HIV disease (HCC) - Plan: CBC with Differential/Platelet, Comprehensive metabolic panel, Lipid panel, HIV-1 RNA quant-no reflex-bld, T-helper cells (CD4) count (not at [facility]), bictegravir-emtricitabine-tenofovir AF (BIKTARVY) 50-200-25 MG TABS tablet  Encounter for long-term (current) use of medications  Family history of Lynch syndrome  Contact with or exposure to communicable disease - Plan: RPR, doxycycline (VIBRA-TABS) 100 MG tablet, GC/Chlamydia Probe Amp Discussed switching to a different  antiviral due to his insurance companies preference.  He is okay with that.  Also discussed the use of doxycycline for prevention of STDs as opposed to treatment.  Explained that there are studies out the says that this does work.  Explained that this is not necessarily the standard of care.

## 2021-06-06 LAB — GC/CHLAMYDIA PROBE AMP
Chlamydia trachomatis, NAA: NEGATIVE
Neisseria Gonorrhoeae by PCR: NEGATIVE

## 2021-06-07 LAB — COMPREHENSIVE METABOLIC PANEL
ALT: 22 IU/L (ref 0–44)
AST: 22 IU/L (ref 0–40)
Albumin/Globulin Ratio: 1.5 (ref 1.2–2.2)
Albumin: 4.3 g/dL (ref 4.0–5.0)
Alkaline Phosphatase: 74 IU/L (ref 44–121)
BUN/Creatinine Ratio: 13 (ref 9–20)
BUN: 13 mg/dL (ref 6–20)
Bilirubin Total: 0.3 mg/dL (ref 0.0–1.2)
CO2: 26 mmol/L (ref 20–29)
Calcium: 9.2 mg/dL (ref 8.7–10.2)
Chloride: 99 mmol/L (ref 96–106)
Creatinine, Ser: 1.01 mg/dL (ref 0.76–1.27)
Globulin, Total: 2.9 g/dL (ref 1.5–4.5)
Glucose: 96 mg/dL (ref 70–99)
Potassium: 4.3 mmol/L (ref 3.5–5.2)
Sodium: 140 mmol/L (ref 134–144)
Total Protein: 7.2 g/dL (ref 6.0–8.5)
eGFR: 98 mL/min/{1.73_m2} (ref >59)

## 2021-06-07 LAB — T-HELPER CELLS (CD4) COUNT (NOT AT ARMC)
% CD 4 Pos. Lymph.: 44.9 % (ref 30.8–58.5)
Absolute CD 4 Helper: 898 /uL (ref 359–1519)
Basophils Absolute: 0 10*3/uL (ref 0.0–0.2)
Basos: 1 %
EOS (ABSOLUTE): 0.1 10*3/uL (ref 0.0–0.4)
Eos: 2 %
Hematocrit: 42.9 % (ref 37.5–51.0)
Hemoglobin: 15 g/dL (ref 13.0–17.7)
Immature Grans (Abs): 0 10*3/uL (ref 0.0–0.1)
Immature Granulocytes: 0 %
Lymphocytes Absolute: 2 10*3/uL (ref 0.7–3.1)
Lymphs: 35 %
MCH: 31.8 pg (ref 26.6–33.0)
MCHC: 35 g/dL (ref 31.5–35.7)
MCV: 91 fL (ref 79–97)
Monocytes Absolute: 0.4 10*3/uL (ref 0.1–0.9)
Monocytes: 6 %
Neutrophils Absolute: 3.3 10*3/uL (ref 1.4–7.0)
Neutrophils: 56 %
Platelets: 222 10*3/uL (ref 150–450)
RBC: 4.71 x10E6/uL (ref 4.14–5.80)
RDW: 12.2 % (ref 11.6–15.4)
WBC: 5.7 10*3/uL (ref 3.4–10.8)

## 2021-06-07 LAB — LIPID PANEL
Chol/HDL Ratio: 2.1 ratio (ref 0.0–5.0)
Cholesterol, Total: 210 mg/dL — ABNORMAL HIGH (ref 100–199)
HDL: 101 mg/dL (ref >39)
LDL Chol Calc (NIH): 98 mg/dL (ref 0–99)
Triglycerides: 62 mg/dL (ref 0–149)
VLDL Cholesterol Cal: 11 mg/dL (ref 5–40)

## 2021-06-07 LAB — HIV-1 RNA QUANT-NO REFLEX-BLD: HIV-1 RNA Viral Load: <20 copies/mL

## 2021-06-07 LAB — RPR: RPR Ser Ql: NONREACTIVE

## 2021-07-12 ENCOUNTER — Telehealth: Payer: Self-pay

## 2021-07-12 NOTE — Telephone Encounter (Signed)
Worked on P.A. over the weekend but pt's insurance no longer valid.  Called pt and he no longer has any active coverage but states [facility] was able to use a discount card and went thru for $0 co pay.  Advised if has any issues next month to call and let me know.  There is [employer] if has issues in future.  Called [facility] t#[phone], verified Susanne Borders has been shipped with $0 co pay.

## 2021-10-15 ENCOUNTER — Encounter: Payer: Self-pay | Admitting: Family Medicine

## 2021-10-15 ENCOUNTER — Ambulatory Visit: Payer: 59 | Admitting: Family Medicine

## 2021-10-15 VITALS — BP 120/80 | HR 68 | Temp 97.0°F | Wt 172.2 lb

## 2021-10-15 DIAGNOSIS — Z800 Family history of malignant neoplasm of digestive organs: Secondary | ICD-10-CM

## 2021-10-15 DIAGNOSIS — K635 Polyp of colon: Secondary | ICD-10-CM | POA: Diagnosis not present

## 2021-10-15 DIAGNOSIS — Z209 Contact with and (suspected) exposure to unspecified communicable disease: Secondary | ICD-10-CM

## 2021-10-15 DIAGNOSIS — F40243 Fear of flying: Secondary | ICD-10-CM

## 2021-10-15 DIAGNOSIS — B20 Human immunodeficiency virus [HIV] disease: Secondary | ICD-10-CM | POA: Diagnosis not present

## 2021-10-15 DIAGNOSIS — Z79899 Other long term (current) drug therapy: Secondary | ICD-10-CM | POA: Diagnosis not present

## 2021-10-15 NOTE — Progress Notes (Signed)
? ?  Subjective:  ? ? Patient ID: James Jensen, male    DOB: [birth year], 36 y.o.   MRN: [id number] ? ?HPI ?He is here for an interval evaluation.  He is now taking Biktarvy and having no difficulty with that.  He does occasionally smoke marijuana.  He is also cut back on his alcohol consumption.  He is getting ready to graduate.  He is scheduled for follow-up colonoscopy for history of polyps.  He does admit to occasional sexual activity and would like to be STD tested. ? ? ?Review of Systems ? ?   ?Objective:  ? Physical Exam ?Alert and in no distress. Tympanic membranes and canals are normal. Pharyngeal area is normal. Neck is supple without adenopathy or thyromegaly. Cardiac exam shows a regular sinus rhythm without murmurs or gallops. Lungs are clear to auscultation. ?Abdominal exam shows no masses or tenderness.  No axillary or inguinal adenopathy. ? ? ? ?   ?Assessment & Plan:  ?HIV disease (HCC) - Plan: CBC with Differential/Platelet, Comprehensive metabolic panel, HIV-1 RNA quant-no reflex-bld, Lipid panel, T-helper cells (CD4) count (not at [facility]) ? ?Polyp of colon, unspecified part of colon, unspecified type ? ?Encounter for long-term (current) use of medications - Plan: CBC with Differential/Platelet, Comprehensive metabolic panel, HIV-1 RNA quant-no reflex-bld, Lipid panel, T-helper cells (CD4) count (not at [facility]) ? ?Family history of colon cancer ? ?Family history of Lynch syndrome ? ?Fear of flying ? ?Contact with or exposure to communicable disease - Plan: GC/Chlamydia Probe Amp, RPR ? ? ?

## 2021-10-17 LAB — T-HELPER CELLS (CD4) COUNT (NOT AT ARMC)
% CD 4 Pos. Lymph.: 49.1 % (ref 30.8–58.5)
Absolute CD 4 Helper: 737 /uL (ref 359–1519)
Basophils Absolute: 0 10*3/uL (ref 0.0–0.2)
Basos: 1 %
EOS (ABSOLUTE): 0.1 10*3/uL (ref 0.0–0.4)
Eos: 1 %
Hematocrit: 42.7 % (ref 37.5–51.0)
Hemoglobin: 14.8 g/dL (ref 13.0–17.7)
Immature Grans (Abs): 0 10*3/uL (ref 0.0–0.1)
Immature Granulocytes: 0 %
Lymphocytes Absolute: 1.5 10*3/uL (ref 0.7–3.1)
Lymphs: 23 %
MCH: 31.6 pg (ref 26.6–33.0)
MCHC: 34.7 g/dL (ref 31.5–35.7)
MCV: 91 fL (ref 79–97)
Monocytes Absolute: 0.5 10*3/uL (ref 0.1–0.9)
Monocytes: 8 %
Neutrophils Absolute: 4.2 10*3/uL (ref 1.4–7.0)
Neutrophils: 67 %
Platelets: 213 10*3/uL (ref 150–450)
RBC: 4.68 x10E6/uL (ref 4.14–5.80)
RDW: 12.4 % (ref 11.6–15.4)
WBC: 6.2 10*3/uL (ref 3.4–10.8)

## 2021-10-17 LAB — COMPREHENSIVE METABOLIC PANEL
ALT: 46 IU/L — ABNORMAL HIGH (ref 0–44)
AST: 44 IU/L — ABNORMAL HIGH (ref 0–40)
Albumin/Globulin Ratio: 1.9 (ref 1.2–2.2)
Albumin: 4.7 g/dL (ref 4.0–5.0)
Alkaline Phosphatase: 67 IU/L (ref 44–121)
BUN/Creatinine Ratio: 14 (ref 9–20)
BUN: 13 mg/dL (ref 6–20)
Bilirubin Total: 0.4 mg/dL (ref 0.0–1.2)
CO2: 24 mmol/L (ref 20–29)
Calcium: 9.8 mg/dL (ref 8.7–10.2)
Chloride: 99 mmol/L (ref 96–106)
Creatinine, Ser: 0.96 mg/dL (ref 0.76–1.27)
Globulin, Total: 2.5 g/dL (ref 1.5–4.5)
Glucose: 91 mg/dL (ref 70–99)
Potassium: 4.9 mmol/L (ref 3.5–5.2)
Sodium: 137 mmol/L (ref 134–144)
Total Protein: 7.2 g/dL (ref 6.0–8.5)
eGFR: 104 mL/min/{1.73_m2} (ref >59)

## 2021-10-17 LAB — LIPID PANEL
Chol/HDL Ratio: 2.1 ratio (ref 0.0–5.0)
Cholesterol, Total: 163 mg/dL (ref 100–199)
HDL: 76 mg/dL (ref >39)
LDL Chol Calc (NIH): 73 mg/dL (ref 0–99)
Triglycerides: 73 mg/dL (ref 0–149)
VLDL Cholesterol Cal: 14 mg/dL (ref 5–40)

## 2021-10-17 LAB — HIV-1 RNA QUANT-NO REFLEX-BLD: HIV-1 RNA Viral Load: <20 copies/mL

## 2021-10-17 LAB — GC/CHLAMYDIA PROBE AMP
Chlamydia trachomatis, NAA: NEGATIVE
Neisseria Gonorrhoeae by PCR: NEGATIVE

## 2021-10-17 LAB — RPR: RPR Ser Ql: NONREACTIVE

## 2021-10-19 ENCOUNTER — Encounter: Payer: Self-pay | Admitting: Family Medicine

## 2022-01-26 ENCOUNTER — Encounter: Payer: Self-pay | Admitting: Family Medicine

## 2022-02-23 ENCOUNTER — Other Ambulatory Visit: Payer: Self-pay | Admitting: Family Medicine

## 2022-02-23 ENCOUNTER — Encounter: Payer: Self-pay | Admitting: Internal Medicine

## 2022-02-23 MED ORDER — CARISOPRODOL 350 MG PO TABS
350.0000 mg | ORAL_TABLET | Freq: Three times a day (TID) | ORAL | 0 refills | Status: AC | PRN
Start: 2022-02-23 — End: ?

## 2022-02-23 MED ORDER — CARISOPRODOL 350 MG PO TABS: 350.0000 mg | ORAL_TABLET | Freq: Three times a day (TID) | ORAL | 0 refills | Status: DC | PRN

## 2022-02-23 NOTE — Progress Notes (Signed)
He called stating he is having neck pain but is getting ready to leave to go to school in [place].  I will call in some someone to help and give him instructions on proper care of the neck pain.

## 2022-03-21 ENCOUNTER — Encounter: Payer: Self-pay | Admitting: Family Medicine

## 2022-03-21 ENCOUNTER — Telehealth: Payer: Self-pay | Admitting: Family Medicine

## 2022-03-21 NOTE — Telephone Encounter (Signed)
Letter emailed to patient per Dr. Redmond School

## 2022-03-28 ENCOUNTER — Telehealth: Payer: Self-pay | Admitting: Family Medicine

## 2022-03-28 ENCOUNTER — Encounter: Payer: Self-pay | Admitting: Family Medicine

## 2022-03-28 NOTE — Telephone Encounter (Signed)
Emailed letter to patient at [email or web address]

## 2022-03-29 ENCOUNTER — Encounter: Payer: Self-pay | Admitting: Internal Medicine

## 2022-04-11 ENCOUNTER — Encounter: Payer: Self-pay | Admitting: Internal Medicine

## 2022-07-04 ENCOUNTER — Encounter: Payer: Self-pay | Admitting: Internal Medicine

## 2022-08-11 ENCOUNTER — Telehealth: Payer: Self-pay | Admitting: Family Medicine

## 2022-08-11 NOTE — Telephone Encounter (Signed)
Called pt James Jensen, he needs med check or cpe with fasting labs.

## 2023-08-15 ENCOUNTER — Encounter: Payer: Self-pay | Admitting: Internal Medicine
